# Patient Record
Sex: Male | Born: 1950 | Race: White | Hispanic: No | Marital: Married | Smoking: Former smoker
Health system: Southern US, Community
[De-identification: ages and names within clinical notes are randomized; demographics above are authoritative.]

## PROBLEM LIST (undated history)

## (undated) DIAGNOSIS — I714 Abdominal aortic aneurysm, without rupture, unspecified: Secondary | ICD-10-CM

## (undated) HISTORY — PX: CARDIAC CATHETERIZATION: SHX172

## (undated) HISTORY — DX: Abdominal aortic aneurysm, without rupture, unspecified: I71.40

---

## 1999-01-21 ENCOUNTER — Encounter: Payer: Self-pay | Admitting: Internal Medicine

## 1999-01-21 ENCOUNTER — Ambulatory Visit (HOSPITAL_COMMUNITY): Admission: RE | Admit: 1999-01-21 | Discharge: 1999-01-21 | Payer: Self-pay | Admitting: Internal Medicine

## 2010-09-25 ENCOUNTER — Ambulatory Visit: Payer: Self-pay | Admitting: Internal Medicine

## 2013-01-26 DIAGNOSIS — R7303 Prediabetes: Secondary | ICD-10-CM | POA: Insufficient documentation

## 2013-01-26 DIAGNOSIS — E559 Vitamin D deficiency, unspecified: Secondary | ICD-10-CM | POA: Insufficient documentation

## 2013-01-26 DIAGNOSIS — G4700 Insomnia, unspecified: Secondary | ICD-10-CM | POA: Insufficient documentation

## 2013-09-11 DIAGNOSIS — I252 Old myocardial infarction: Secondary | ICD-10-CM | POA: Insufficient documentation

## 2013-09-11 DIAGNOSIS — G44009 Cluster headache syndrome, unspecified, not intractable: Secondary | ICD-10-CM | POA: Insufficient documentation

## 2013-09-11 DIAGNOSIS — I10 Essential (primary) hypertension: Secondary | ICD-10-CM | POA: Insufficient documentation

## 2013-09-11 HISTORY — DX: Old myocardial infarction: I25.2

## 2014-05-17 DIAGNOSIS — F419 Anxiety disorder, unspecified: Secondary | ICD-10-CM | POA: Insufficient documentation

## 2014-12-02 DIAGNOSIS — Z720 Tobacco use: Secondary | ICD-10-CM | POA: Insufficient documentation

## 2014-12-02 DIAGNOSIS — K219 Gastro-esophageal reflux disease without esophagitis: Secondary | ICD-10-CM | POA: Insufficient documentation

## 2015-02-06 DIAGNOSIS — Z955 Presence of coronary angioplasty implant and graft: Secondary | ICD-10-CM | POA: Insufficient documentation

## 2016-05-01 DIAGNOSIS — F1020 Alcohol dependence, uncomplicated: Secondary | ICD-10-CM | POA: Insufficient documentation

## 2016-07-15 DIAGNOSIS — I714 Abdominal aortic aneurysm, without rupture, unspecified: Secondary | ICD-10-CM | POA: Insufficient documentation

## 2017-01-17 DIAGNOSIS — I2510 Atherosclerotic heart disease of native coronary artery without angina pectoris: Secondary | ICD-10-CM | POA: Insufficient documentation

## 2017-08-20 DIAGNOSIS — D539 Nutritional anemia, unspecified: Secondary | ICD-10-CM | POA: Insufficient documentation

## 2018-03-14 DIAGNOSIS — H6983 Other specified disorders of Eustachian tube, bilateral: Secondary | ICD-10-CM | POA: Insufficient documentation

## 2018-06-22 DIAGNOSIS — N529 Male erectile dysfunction, unspecified: Secondary | ICD-10-CM | POA: Insufficient documentation

## 2019-05-02 DIAGNOSIS — Z880 Allergy status to penicillin: Secondary | ICD-10-CM | POA: Diagnosis not present

## 2019-05-02 DIAGNOSIS — Z1211 Encounter for screening for malignant neoplasm of colon: Secondary | ICD-10-CM | POA: Diagnosis not present

## 2019-05-02 DIAGNOSIS — Z885 Allergy status to narcotic agent status: Secondary | ICD-10-CM | POA: Diagnosis not present

## 2019-05-02 DIAGNOSIS — Z881 Allergy status to other antibiotic agents status: Secondary | ICD-10-CM | POA: Diagnosis not present

## 2019-05-02 DIAGNOSIS — Z9181 History of falling: Secondary | ICD-10-CM | POA: Diagnosis not present

## 2019-05-15 DIAGNOSIS — N529 Male erectile dysfunction, unspecified: Secondary | ICD-10-CM | POA: Diagnosis not present

## 2019-06-20 DIAGNOSIS — E559 Vitamin D deficiency, unspecified: Secondary | ICD-10-CM | POA: Diagnosis not present

## 2019-06-20 DIAGNOSIS — E785 Hyperlipidemia, unspecified: Secondary | ICD-10-CM | POA: Diagnosis not present

## 2019-06-20 DIAGNOSIS — D638 Anemia in other chronic diseases classified elsewhere: Secondary | ICD-10-CM | POA: Diagnosis not present

## 2019-06-20 DIAGNOSIS — N289 Disorder of kidney and ureter, unspecified: Secondary | ICD-10-CM | POA: Diagnosis not present

## 2019-06-20 DIAGNOSIS — F332 Major depressive disorder, recurrent severe without psychotic features: Secondary | ICD-10-CM | POA: Diagnosis not present

## 2019-06-20 DIAGNOSIS — Z125 Encounter for screening for malignant neoplasm of prostate: Secondary | ICD-10-CM | POA: Diagnosis not present

## 2019-06-20 DIAGNOSIS — I714 Abdominal aortic aneurysm, without rupture: Secondary | ICD-10-CM | POA: Diagnosis not present

## 2019-06-20 DIAGNOSIS — I10 Essential (primary) hypertension: Secondary | ICD-10-CM | POA: Diagnosis not present

## 2019-06-20 DIAGNOSIS — R69 Illness, unspecified: Secondary | ICD-10-CM | POA: Diagnosis not present

## 2019-07-17 DIAGNOSIS — G4700 Insomnia, unspecified: Secondary | ICD-10-CM | POA: Diagnosis not present

## 2019-07-17 DIAGNOSIS — K219 Gastro-esophageal reflux disease without esophagitis: Secondary | ICD-10-CM | POA: Diagnosis not present

## 2019-07-17 DIAGNOSIS — Z1211 Encounter for screening for malignant neoplasm of colon: Secondary | ICD-10-CM | POA: Diagnosis not present

## 2019-07-17 DIAGNOSIS — K5730 Diverticulosis of large intestine without perforation or abscess without bleeding: Secondary | ICD-10-CM | POA: Diagnosis not present

## 2019-07-17 DIAGNOSIS — E785 Hyperlipidemia, unspecified: Secondary | ICD-10-CM | POA: Diagnosis not present

## 2019-07-17 DIAGNOSIS — I2510 Atherosclerotic heart disease of native coronary artery without angina pectoris: Secondary | ICD-10-CM | POA: Diagnosis not present

## 2019-07-17 DIAGNOSIS — K648 Other hemorrhoids: Secondary | ICD-10-CM | POA: Diagnosis not present

## 2019-07-17 DIAGNOSIS — I10 Essential (primary) hypertension: Secondary | ICD-10-CM | POA: Diagnosis not present

## 2019-07-17 DIAGNOSIS — Z808 Family history of malignant neoplasm of other organs or systems: Secondary | ICD-10-CM | POA: Diagnosis not present

## 2019-07-17 DIAGNOSIS — F419 Anxiety disorder, unspecified: Secondary | ICD-10-CM | POA: Diagnosis not present

## 2019-07-17 DIAGNOSIS — Z87891 Personal history of nicotine dependence: Secondary | ICD-10-CM | POA: Diagnosis not present

## 2019-07-17 DIAGNOSIS — I714 Abdominal aortic aneurysm, without rupture: Secondary | ICD-10-CM | POA: Diagnosis not present

## 2019-07-17 DIAGNOSIS — D638 Anemia in other chronic diseases classified elsewhere: Secondary | ICD-10-CM | POA: Diagnosis not present

## 2019-07-17 LAB — HM COLONOSCOPY

## 2019-08-03 DIAGNOSIS — M5030 Other cervical disc degeneration, unspecified cervical region: Secondary | ICD-10-CM | POA: Insufficient documentation

## 2019-08-03 DIAGNOSIS — I10 Essential (primary) hypertension: Secondary | ICD-10-CM | POA: Diagnosis not present

## 2019-08-03 DIAGNOSIS — N529 Male erectile dysfunction, unspecified: Secondary | ICD-10-CM | POA: Diagnosis not present

## 2019-12-07 DIAGNOSIS — M5408 Panniculitis affecting regions of neck and back, sacral and sacrococcygeal region: Secondary | ICD-10-CM | POA: Diagnosis not present

## 2019-12-07 DIAGNOSIS — M9902 Segmental and somatic dysfunction of thoracic region: Secondary | ICD-10-CM | POA: Diagnosis not present

## 2019-12-07 DIAGNOSIS — M9903 Segmental and somatic dysfunction of lumbar region: Secondary | ICD-10-CM | POA: Diagnosis not present

## 2019-12-07 DIAGNOSIS — M9901 Segmental and somatic dysfunction of cervical region: Secondary | ICD-10-CM | POA: Diagnosis not present

## 2020-02-26 DIAGNOSIS — M9901 Segmental and somatic dysfunction of cervical region: Secondary | ICD-10-CM | POA: Diagnosis not present

## 2020-02-26 DIAGNOSIS — M9903 Segmental and somatic dysfunction of lumbar region: Secondary | ICD-10-CM | POA: Diagnosis not present

## 2020-02-26 DIAGNOSIS — Z23 Encounter for immunization: Secondary | ICD-10-CM | POA: Diagnosis not present

## 2020-02-26 DIAGNOSIS — M5408 Panniculitis affecting regions of neck and back, sacral and sacrococcygeal region: Secondary | ICD-10-CM | POA: Diagnosis not present

## 2020-02-26 DIAGNOSIS — M9902 Segmental and somatic dysfunction of thoracic region: Secondary | ICD-10-CM | POA: Diagnosis not present

## 2020-02-27 DIAGNOSIS — H5203 Hypermetropia, bilateral: Secondary | ICD-10-CM | POA: Diagnosis not present

## 2020-03-01 DIAGNOSIS — Z0100 Encounter for examination of eyes and vision without abnormal findings: Secondary | ICD-10-CM | POA: Diagnosis not present

## 2020-03-11 DIAGNOSIS — M9901 Segmental and somatic dysfunction of cervical region: Secondary | ICD-10-CM | POA: Diagnosis not present

## 2020-03-11 DIAGNOSIS — M9903 Segmental and somatic dysfunction of lumbar region: Secondary | ICD-10-CM | POA: Diagnosis not present

## 2020-03-11 DIAGNOSIS — M9902 Segmental and somatic dysfunction of thoracic region: Secondary | ICD-10-CM | POA: Diagnosis not present

## 2020-03-11 DIAGNOSIS — M5408 Panniculitis affecting regions of neck and back, sacral and sacrococcygeal region: Secondary | ICD-10-CM | POA: Diagnosis not present

## 2021-02-08 DIAGNOSIS — E785 Hyperlipidemia, unspecified: Secondary | ICD-10-CM | POA: Diagnosis not present

## 2021-02-17 DIAGNOSIS — R0600 Dyspnea, unspecified: Secondary | ICD-10-CM | POA: Diagnosis not present

## 2021-02-17 DIAGNOSIS — Z79899 Other long term (current) drug therapy: Secondary | ICD-10-CM | POA: Diagnosis not present

## 2021-02-17 DIAGNOSIS — Z20822 Contact with and (suspected) exposure to covid-19: Secondary | ICD-10-CM | POA: Diagnosis not present

## 2021-02-17 DIAGNOSIS — R0602 Shortness of breath: Secondary | ICD-10-CM | POA: Diagnosis not present

## 2021-02-17 DIAGNOSIS — I10 Essential (primary) hypertension: Secondary | ICD-10-CM | POA: Diagnosis not present

## 2021-03-07 DIAGNOSIS — U071 COVID-19: Secondary | ICD-10-CM | POA: Diagnosis not present

## 2021-03-08 ENCOUNTER — Ambulatory Visit: Payer: Self-pay

## 2021-04-03 DIAGNOSIS — N529 Male erectile dysfunction, unspecified: Secondary | ICD-10-CM | POA: Diagnosis not present

## 2021-04-03 DIAGNOSIS — I10 Essential (primary) hypertension: Secondary | ICD-10-CM | POA: Diagnosis not present

## 2021-04-03 DIAGNOSIS — H6122 Impacted cerumen, left ear: Secondary | ICD-10-CM | POA: Diagnosis not present

## 2021-04-03 DIAGNOSIS — I714 Abdominal aortic aneurysm, without rupture, unspecified: Secondary | ICD-10-CM | POA: Diagnosis not present

## 2021-04-03 DIAGNOSIS — E785 Hyperlipidemia, unspecified: Secondary | ICD-10-CM | POA: Diagnosis not present

## 2021-04-03 DIAGNOSIS — R69 Illness, unspecified: Secondary | ICD-10-CM | POA: Diagnosis not present

## 2021-04-03 DIAGNOSIS — J343 Hypertrophy of nasal turbinates: Secondary | ICD-10-CM | POA: Diagnosis not present

## 2021-04-03 DIAGNOSIS — D229 Melanocytic nevi, unspecified: Secondary | ICD-10-CM | POA: Diagnosis not present

## 2021-04-03 DIAGNOSIS — Z8379 Family history of other diseases of the digestive system: Secondary | ICD-10-CM | POA: Diagnosis not present

## 2021-04-03 DIAGNOSIS — H6983 Other specified disorders of Eustachian tube, bilateral: Secondary | ICD-10-CM | POA: Diagnosis not present

## 2021-04-03 DIAGNOSIS — E538 Deficiency of other specified B group vitamins: Secondary | ICD-10-CM | POA: Diagnosis not present

## 2021-04-03 DIAGNOSIS — E559 Vitamin D deficiency, unspecified: Secondary | ICD-10-CM | POA: Diagnosis not present

## 2021-04-03 DIAGNOSIS — I25118 Atherosclerotic heart disease of native coronary artery with other forms of angina pectoris: Secondary | ICD-10-CM | POA: Diagnosis not present

## 2021-04-03 DIAGNOSIS — M79642 Pain in left hand: Secondary | ICD-10-CM | POA: Diagnosis not present

## 2021-04-03 DIAGNOSIS — K2960 Other gastritis without bleeding: Secondary | ICD-10-CM | POA: Diagnosis not present

## 2021-04-08 ENCOUNTER — Other Ambulatory Visit: Payer: Self-pay | Admitting: Nurse Practitioner

## 2021-04-08 ENCOUNTER — Other Ambulatory Visit (HOSPITAL_COMMUNITY): Payer: Self-pay | Admitting: Nurse Practitioner

## 2021-04-08 DIAGNOSIS — I714 Abdominal aortic aneurysm, without rupture, unspecified: Secondary | ICD-10-CM

## 2021-04-13 ENCOUNTER — Encounter: Payer: Self-pay | Admitting: *Deleted

## 2021-04-16 ENCOUNTER — Other Ambulatory Visit (HOSPITAL_COMMUNITY): Payer: Self-pay | Admitting: Nurse Practitioner

## 2021-04-16 DIAGNOSIS — M79642 Pain in left hand: Secondary | ICD-10-CM

## 2021-04-20 ENCOUNTER — Encounter: Payer: Self-pay | Admitting: Internal Medicine

## 2021-04-20 ENCOUNTER — Ambulatory Visit (INDEPENDENT_AMBULATORY_CARE_PROVIDER_SITE_OTHER): Payer: Medicare HMO | Admitting: Internal Medicine

## 2021-04-20 ENCOUNTER — Other Ambulatory Visit: Payer: Self-pay

## 2021-04-20 VITALS — BP 108/78 | HR 97 | Resp 18 | Ht 70.0 in | Wt 194.0 lb

## 2021-04-20 DIAGNOSIS — I7143 Infrarenal abdominal aortic aneurysm, without rupture: Secondary | ICD-10-CM | POA: Diagnosis not present

## 2021-04-20 DIAGNOSIS — I2510 Atherosclerotic heart disease of native coronary artery without angina pectoris: Secondary | ICD-10-CM

## 2021-04-20 DIAGNOSIS — F5101 Primary insomnia: Secondary | ICD-10-CM

## 2021-04-20 DIAGNOSIS — E782 Mixed hyperlipidemia: Secondary | ICD-10-CM

## 2021-04-20 DIAGNOSIS — K219 Gastro-esophageal reflux disease without esophagitis: Secondary | ICD-10-CM

## 2021-04-20 DIAGNOSIS — N529 Male erectile dysfunction, unspecified: Secondary | ICD-10-CM | POA: Diagnosis not present

## 2021-04-20 DIAGNOSIS — H6983 Other specified disorders of Eustachian tube, bilateral: Secondary | ICD-10-CM

## 2021-04-20 DIAGNOSIS — J309 Allergic rhinitis, unspecified: Secondary | ICD-10-CM | POA: Diagnosis not present

## 2021-04-20 DIAGNOSIS — I71012 Dissection of descending thoracic aorta: Secondary | ICD-10-CM | POA: Insufficient documentation

## 2021-04-20 DIAGNOSIS — R69 Illness, unspecified: Secondary | ICD-10-CM | POA: Diagnosis not present

## 2021-04-20 DIAGNOSIS — H6993 Unspecified Eustachian tube disorder, bilateral: Secondary | ICD-10-CM

## 2021-04-20 MED ORDER — METOPROLOL SUCCINATE ER 25 MG PO TB24: 25.0000 mg | ORAL_TABLET | Freq: Every day | ORAL | 1 refills | Status: DC

## 2021-04-20 MED ORDER — ATORVASTATIN CALCIUM 40 MG PO TABS: 40.0000 mg | ORAL_TABLET | Freq: Every day | ORAL | 1 refills | Status: DC

## 2021-04-20 MED ORDER — TRAZODONE HCL 50 MG PO TABS: 50.0000 mg | ORAL_TABLET | Freq: Every day | ORAL | 1 refills | Status: DC

## 2021-04-20 NOTE — Patient Instructions (Signed)
Please continue taking medications as prescribed.  Please schedule CTA abdomen when you get chance soon.  Please contact Cardiology and Urology to schedule appointment.

## 2021-04-22 ENCOUNTER — Ambulatory Visit (HOSPITAL_COMMUNITY)
Admission: RE | Admit: 2021-04-22 | Discharge: 2021-04-22 | Disposition: A | Discharge status: Home / Self Care | Payer: Medicare HMO | Source: Ambulatory Visit | Attending: Nurse Practitioner | Admitting: Nurse Practitioner

## 2021-04-22 ENCOUNTER — Other Ambulatory Visit: Payer: Self-pay

## 2021-04-22 DIAGNOSIS — M79642 Pain in left hand: Secondary | ICD-10-CM | POA: Insufficient documentation

## 2021-04-24 DIAGNOSIS — J309 Allergic rhinitis, unspecified: Secondary | ICD-10-CM | POA: Insufficient documentation

## 2021-04-24 DIAGNOSIS — I7143 Infrarenal abdominal aortic aneurysm, without rupture: Secondary | ICD-10-CM | POA: Insufficient documentation

## 2021-04-24 DIAGNOSIS — E782 Mixed hyperlipidemia: Secondary | ICD-10-CM | POA: Insufficient documentation

## 2021-04-24 NOTE — Progress Notes (Signed)
New Patient Office Visit  Subjective:  Patient ID: JASON FRISBEE, male    DOB: [birth year]  Age: 71 y.o. MRN: [id number]  CC:  Chief Complaint  Patient presents with   New Patient (Initial Visit)    New patient was seeing Maceo just saw 10days ago establishing care     HPI JURIEL CID is a 71 y.o. male with past medical history of CAD s/p stent placement, HLD and aortic aneurysm who presents for establishing care.  He recently saw an NP at [facility] and was referred to cardiology, vascular surgery, ENT and dermatology.  CAD s/p stent placement: He used to follow-up with cardiology in the past.  He currently takes metoprolol and statin.  Denies any chest pain, dyspnea or palpitations.  He is to schedule appointment with cardiology in [place].  He has not required nitroglycerin for a long time.  History of aortic aneurysm: Has had stable in size infrarenal abdominal aortic aneurysm, for which he used to see vascular surgery.  He had been traveling for about 18 months and was not able to see Vascular surgeon and Cardiologist.  He denies any abdominal pain currently.  He has history of erectile dysfunction, for which he uses sildenafil.  He was referred to ENT specialist for allergic rhinitis and eustachian tube dysfunction, and is awaiting appointment.  He was also referred to dermatology for a mole on his back.   Past Medical History:  Diagnosis Date   Old myocardial infarction 09/11/2013   Formatting of this note might be different from the original. Overview:  01/2011 Formatting of this note might be different from the original. 01/2011    History reviewed. No pertinent surgical history.  History reviewed. No pertinent family history.  Social History   Socioeconomic History   Marital status: Single    Spouse name: Not on file   Number of children: Not on file   Years of education: Not on file   Highest education level: Not on file  Occupational  History   Not on file  Tobacco Use   Smoking status: Former    Types: Cigarettes    Quit date: 06/02/2018    Years since quitting: 2.8   Smokeless tobacco: Never  Substance and Sexual Activity   Alcohol use: Not Currently   Drug use: Never   Sexual activity: Not on file  Other Topics Concern   Not on file  Social History Narrative   Not on file   Social Determinants of Health   Financial Resource Strain: Not on file  Food Insecurity: Not on file  Transportation Needs: Not on file  Physical Activity: Not on file  Stress: Not on file  Social Connections: Not on file  Intimate Partner Violence: Not on file    ROS Review of Systems  Constitutional:  Negative for chills and fever.  HENT:  Positive for congestion, postnasal drip and rhinorrhea. Negative for sore throat.   Eyes:  Negative for pain and discharge.  Respiratory:  Negative for cough and shortness of breath.   Cardiovascular:  Negative for chest pain and palpitations.  Gastrointestinal:  Negative for diarrhea, nausea and vomiting.  Endocrine: Negative for polydipsia and polyuria.  Genitourinary:  Negative for dysuria and hematuria.  Musculoskeletal:  Negative for neck pain and neck stiffness.  Skin:  Negative for rash.       Mole on back  Neurological:  Negative for dizziness, weakness, numbness and headaches.  Psychiatric/Behavioral:  Negative for agitation  and behavioral problems.    Objective:   Today's Vitals: BP 108/78 (BP Location: Right Arm, Patient Position: Sitting, Cuff Size: Normal)    Pulse 97    Resp 18    Ht 5\' 10"  (1.778 m)    Wt 194 lb 0.6 oz (88 kg)    SpO2 98%    BMI 27.84 kg/m   Physical Exam Vitals reviewed.  Constitutional:      General: He is not in acute distress.    Appearance: He is not diaphoretic.  HENT:     Head: Normocephalic and atraumatic.     Nose: Nose normal.     Mouth/Throat:     Mouth: Mucous membranes are moist.  Eyes:     General: No scleral icterus.    Extraocular  Movements: Extraocular movements intact.  Cardiovascular:     Rate and Rhythm: Normal rate and regular rhythm.     Pulses: Normal pulses.     Heart sounds: Normal heart sounds. No murmur heard. Pulmonary:     Breath sounds: Normal breath sounds. No wheezing or rales.  Abdominal:     Palpations: Abdomen is soft.     Tenderness: There is no abdominal tenderness.  Musculoskeletal:     Cervical back: Neck supple. No tenderness.     Right lower leg: No edema.     Left lower leg: No edema.  Skin:    General: Skin is warm.     Findings: No rash.  Neurological:     General: No focal deficit present.     Mental Status: He is alert and oriented to person, place, and time.     Sensory: No sensory deficit.     Motor: No weakness.  Psychiatric:        Mood and Affect: Mood normal.        Behavior: Behavior normal.    Assessment & Plan:   Problem List Items Addressed This Visit       Cardiovascular and Mediastinum   Coronary artery disease - Primary    S/p stent placement Was referred to Cardiology recently, has not scheduled appointment yet No chest pain currently On aspirin and statin Takes beta-blocker and as needed nitroglycerin, has not required it lately      Relevant Medications   nitroGLYCERIN (NITROSTAT) 0.4 MG SL tablet   sildenafil (REVATIO) 20 MG tablet   atorvastatin (LIPITOR) 40 MG tablet   metoprolol succinate (TOPROL-XL) 25 MG 24 hr tablet   Infrarenal abdominal aortic aneurysm (AAA) without rupture    Used to follow-up with vascular surgery, needs to schedule appointment      Relevant Medications   nitroGLYCERIN (NITROSTAT) 0.4 MG SL tablet   sildenafil (REVATIO) 20 MG tablet   atorvastatin (LIPITOR) 40 MG tablet   metoprolol succinate (TOPROL-XL) 25 MG 24 hr tablet     Respiratory   Allergic sinusitis    Uses Flonase Has been referred to ENT specialist by [facility]      Relevant Medications   fluticasone (FLONASE) 50 MCG/ACT nasal spray      Digestive   Gastro-esophageal reflux disease without esophagitis    Well controlled with omeprazole      Relevant Medications   omeprazole (PRILOSEC) 40 MG capsule     Nervous and Auditory   Dysfunction of both eustachian tubes    Was referred to ENT specialist recently, awaiting appointment        Other   Erectile dysfunction    Takes sildenafil as needed  Aware that he should not take nitroglycerin and sildenafil together      Insomnia    Takes trazodone 50 mg nightly as needed      Relevant Medications   traZODone (DESYREL) 50 MG tablet   Mixed hyperlipidemia    On statin Check lipid profile in the next visit      Relevant Medications   nitroGLYCERIN (NITROSTAT) 0.4 MG SL tablet   sildenafil (REVATIO) 20 MG tablet   atorvastatin (LIPITOR) 40 MG tablet   metoprolol succinate (TOPROL-XL) 25 MG 24 hr tablet    Outpatient Encounter Medications as of 04/20/2021  Medication Sig   ergocalciferol (VITAMIN D2) 1.25 MG (50000 UT) capsule Take by mouth.   fluticasone (FLONASE) 50 MCG/ACT nasal spray SMARTSIG:1 Spray(s) Both Nares Daily PRN   magnesium 30 MG tablet Take by mouth.   nitroGLYCERIN (NITROSTAT) 0.4 MG SL tablet SMARTSIG:1 Tablet(s) Sublingual PRN   omeprazole (PRILOSEC) 40 MG capsule Take 40 mg by mouth daily.   sildenafil (REVATIO) 20 MG tablet Take 20 mg by mouth as needed.   [DISCONTINUED] atorvastatin (LIPITOR) 40 MG tablet Take 40 mg by mouth daily.   [DISCONTINUED] metoprolol succinate (TOPROL-XL) 25 MG 24 hr tablet Take 25 mg by mouth daily.   [DISCONTINUED] traZODone (DESYREL) 50 MG tablet Take by mouth.   atorvastatin (LIPITOR) 40 MG tablet Take 1 tablet (40 mg total) by mouth daily.   metoprolol succinate (TOPROL-XL) 25 MG 24 hr tablet Take 1 tablet (25 mg total) by mouth daily.   traZODone (DESYREL) 50 MG tablet Take 1 tablet (50 mg total) by mouth at bedtime.   No facility-administered encounter medications on file as of 04/20/2021.    Follow-up:  Return in about 4 months (around 08/18/2021) for CAD, aortic aneurysm and insomnia.   Lindell Spar, MD

## 2021-04-24 NOTE — Assessment & Plan Note (Signed)
Takes sildenafil as needed Aware that he should not take nitroglycerin and sildenafil together

## 2021-04-24 NOTE — Assessment & Plan Note (Signed)
Takes trazodone 50 mg nightly as needed 

## 2021-04-24 NOTE — Assessment & Plan Note (Signed)
On statin Check lipid profile in the next visit

## 2021-04-24 NOTE — Assessment & Plan Note (Addendum)
S/p stent placement Was referred to Cardiology recently, has not scheduled appointment yet No chest pain currently On aspirin and statin Takes beta-blocker and as needed nitroglycerin, has not required it lately

## 2021-04-24 NOTE — Assessment & Plan Note (Signed)
Well controlled with omeprazole. 

## 2021-04-24 NOTE — Assessment & Plan Note (Signed)
Was referred to ENT specialist recently, awaiting appointment

## 2021-04-24 NOTE — Assessment & Plan Note (Signed)
Uses Flonase Has been referred to ENT specialist by [facility]

## 2021-04-24 NOTE — Assessment & Plan Note (Signed)
Used to follow-up with vascular surgery, needs to schedule appointment 

## 2021-05-05 ENCOUNTER — Other Ambulatory Visit (HOSPITAL_COMMUNITY): Payer: Self-pay | Admitting: Vascular Surgery

## 2021-05-05 DIAGNOSIS — I714 Abdominal aortic aneurysm, without rupture, unspecified: Secondary | ICD-10-CM

## 2021-05-06 ENCOUNTER — Ambulatory Visit (INDEPENDENT_AMBULATORY_CARE_PROVIDER_SITE_OTHER): Payer: Medicare HMO

## 2021-05-06 ENCOUNTER — Encounter: Payer: Self-pay | Admitting: Vascular Surgery

## 2021-05-06 ENCOUNTER — Ambulatory Visit: Payer: Medicare HMO | Admitting: Vascular Surgery

## 2021-05-06 ENCOUNTER — Other Ambulatory Visit: Payer: Self-pay

## 2021-05-06 VITALS — BP 136/84 | HR 77 | Temp 98.2°F | Resp 14 | Ht 70.0 in | Wt 194.0 lb

## 2021-05-06 DIAGNOSIS — I7143 Infrarenal abdominal aortic aneurysm, without rupture: Secondary | ICD-10-CM | POA: Diagnosis not present

## 2021-05-06 DIAGNOSIS — I714 Abdominal aortic aneurysm, without rupture, unspecified: Secondary | ICD-10-CM | POA: Diagnosis not present

## 2021-05-06 NOTE — Progress Notes (Signed)
[facility] [place]  Patient name: Jeremy Jenkins MRN: [id number] DOB: July 02, 1950 Sex: male  REASON FOR CONSULT: Evaluation abdominal aortic aneurysm  HPI: Jeremy Jenkins is a 71 y.o. male, who is here today for follow-up abdominal arctic aneurysm.  He is here today with his fiance who will be married later this week.  He has a noted history of aneurysm.  He thinks that this dates back to 2018.  He recalls being told on various times that his aneurysm ranged from 3.5-4.5.  I do not have any documentation of prior size.  He has no symptoms referable to his aneurysm.  Does have a history of myocardial infarction and coronary stenting in the past.  He has no family history of aneurysmal disease  Past Medical History:  Diagnosis Date   Old myocardial infarction 09/11/2013   Formatting of this note might be different from the original. Overview:  01/2011 Formatting of this note might be different from the original. 01/2011    History reviewed. No pertinent family history.  SOCIAL HISTORY: Social History   Socioeconomic History   Marital status: Single    Spouse name: Not on file   Number of children: Not on file   Years of education: Not on file   Highest education level: Not on file  Occupational History   Not on file  Tobacco Use   Smoking status: Former    Types: Cigarettes    Quit date: 06/02/2018    Years since quitting: 2.9   Smokeless tobacco: Never  Substance and Sexual Activity   Alcohol use: Not Currently   Drug use: Never   Sexual activity: Not on file  Other Topics Concern   Not on file  Social History Narrative   Not on file   Social Determinants of Health   Financial Resource Strain: Not on file  Food Insecurity: Not on file  Transportation Needs: Not on file  Physical Activity: Not on file  Stress: Not on file  Social Connections: Not on file  Intimate Partner Violence: Not on file     Allergies  Allergen Reactions   Albuterol Anaphylaxis   Azithromycin Tinitus    Hearing loss    Penicillins Shortness Of Breath   Tadalafil Shortness Of Breath   Aspartame And Phenylalanine Other (See Comments)    HEADACHES HEADACHES HEADACHES HEADACHES    Topiramate Other (See Comments)    Memory loss Memory loss Memory loss    Buspirone Other (See Comments)    Memory loss Memory loss Memory loss Memory loss    Hydrocodone Other (See Comments)    Tingling all over Tingling all over Tingling all over     Current Outpatient Medications  Medication Sig Dispense Refill   atorvastatin (LIPITOR) 40 MG tablet Take 1 tablet (40 mg total) by mouth daily. 90 tablet 1   ergocalciferol (VITAMIN D2) 1.25 MG (50000 UT) capsule Take by mouth.     fluticasone (FLONASE) 50 MCG/ACT nasal spray SMARTSIG:1 Spray(s) Both Nares Daily PRN     magnesium 30 MG tablet Take by mouth.     metoprolol succinate (TOPROL-XL) 25 MG 24 hr tablet Take 1 tablet (25 mg total) by mouth daily. 90 tablet 1   nitroGLYCERIN (NITROSTAT) 0.4 MG SL tablet SMARTSIG:1 Tablet(s) Sublingual PRN     omeprazole (PRILOSEC) 40 MG capsule Take 40 mg by mouth daily.     sildenafil (REVATIO) 20 MG tablet Take 20 mg by mouth as needed.  traZODone (DESYREL) 50 MG tablet Take 1 tablet (50 mg total) by mouth at bedtime. 90 tablet 1   No current facility-administered medications for this visit.    REVIEW OF SYSTEMS:  [X]  denotes positive finding, [ ]  denotes negative finding Cardiac  Comments:  Chest pain or chest pressure:    Shortness of breath upon exertion:    Short of breath when lying flat:    Irregular heart rhythm:        Vascular    Pain in calf, thigh, or hip brought on by ambulation:    Pain in feet at night that wakes you up from your sleep:     Blood clot in your veins:    Leg swelling:         Pulmonary    Oxygen at home:    Productive cough:  x   Wheezing:         Neurologic    Sudden  weakness in arms or legs:     Sudden numbness in arms or legs:     Sudden onset of difficulty speaking or slurred speech:    Temporary loss of vision in one eye:     Problems with dizziness:         Gastrointestinal    Blood in stool:     Vomited blood:         Genitourinary    Burning when urinating:     Blood in urine:        Psychiatric    Major depression:         Hematologic    Bleeding problems:    Problems with blood clotting too easily:        Skin    Rashes or ulcers:        Constitutional    Fever or chills:      PHYSICAL EXAM: Vitals:   05/06/21 1249  BP: 136/84  Pulse: 77  Resp: 14  Temp: 98.2 F (36.8 C)  TempSrc: Temporal  SpO2: 96%  Weight: 194 lb (88 kg)  Height: 5\' 10"  (1.778 m)    GENERAL: The patient is a well-nourished male, in no acute distress. The vital signs are documented above. CARDIOVASCULAR: 2+ radial 2+ femoral pulses.  2+ popliteal pulse with no evidence of femoral or popliteal aneurysms.  I do not feel an abdominal aortic aneurysm PULMONARY: There is good air exchange  MUSCULOSKELETAL: There are no major deformities or cyanosis. NEUROLOGIC: No focal weakness or paresthesias are detected. SKIN: There are no ulcers or rashes noted. PSYCHIATRIC: The patient has a normal affect.  DATA:  Ultrasound today reveals maximal diameter of his aneurysm at 4.5 cm.  He does have some ectasia of his iliac arteries bilaterally but no aneurysm  MEDICAL ISSUES: Had long discussion with the patient and his family.  I explained the typical nature of aneurysms with slow growth.  I also explained that we would typically recommend repair at the 5.5 cm aneurysm unless he had rapid growth.  Explained the importance of normal blood pressure control.  I did discuss repair of aneurysm with both open and stent graft repair.  Also discussed symptoms of leaking aneurysm and that he should report immediately to the hospital should this occur.  We will see him again  in 1 year with repeat ultrasound of his aorta   Rosetta Posner, MD [facility] [phone] Pager [phone]  Note: Portions of this report  may have been transcribed using voice recognition software.  Every effort has been made to ensure accuracy; however, inadvertent computerized transcription errors may still be present.

## 2021-05-07 ENCOUNTER — Telehealth: Payer: Self-pay

## 2021-05-07 NOTE — Telephone Encounter (Signed)
Pt states that he does not need a AWV he just saw Dr Posey Pronto and everything is fine, he lives in a new house still unboxing things and a AWV would not be beneficial for him or the dr's office.  Maybe next year things may be different but for now no AWV.

## 2021-05-07 NOTE — Progress Notes (Deleted)
Pt states that he just saw Dr Posey Pronto he is great in a new house trying to unbox things and a AWV would not be beneficial to either him or the office.  Maybe next year things will be different but for now he does want to do a AWV.

## 2021-05-13 NOTE — Progress Notes (Unsigned)
Error

## 2021-05-18 ENCOUNTER — Other Ambulatory Visit: Payer: Self-pay

## 2021-05-18 ENCOUNTER — Encounter (HOSPITAL_COMMUNITY): Payer: Self-pay

## 2021-05-18 ENCOUNTER — Emergency Department (HOSPITAL_COMMUNITY): Payer: Medicare HMO

## 2021-05-18 ENCOUNTER — Inpatient Hospital Stay (HOSPITAL_COMMUNITY)
Admission: EM | Admit: 2021-05-18 | Discharge: 2021-05-20 | DRG: 247 | Disposition: A | Discharge status: Home / Self Care | Payer: Medicare HMO | Attending: Cardiovascular Disease | Admitting: Cardiovascular Disease

## 2021-05-18 DIAGNOSIS — I10 Essential (primary) hypertension: Secondary | ICD-10-CM | POA: Diagnosis present

## 2021-05-18 DIAGNOSIS — F1010 Alcohol abuse, uncomplicated: Secondary | ICD-10-CM | POA: Diagnosis present

## 2021-05-18 DIAGNOSIS — T82855A Stenosis of coronary artery stent, initial encounter: Secondary | ICD-10-CM | POA: Diagnosis present

## 2021-05-18 DIAGNOSIS — E785 Hyperlipidemia, unspecified: Secondary | ICD-10-CM | POA: Diagnosis present

## 2021-05-18 DIAGNOSIS — R079 Chest pain, unspecified: Secondary | ICD-10-CM | POA: Diagnosis not present

## 2021-05-18 DIAGNOSIS — I7143 Infrarenal abdominal aortic aneurysm, without rupture: Secondary | ICD-10-CM | POA: Diagnosis present

## 2021-05-18 DIAGNOSIS — Z955 Presence of coronary angioplasty implant and graft: Secondary | ICD-10-CM

## 2021-05-18 DIAGNOSIS — Z79899 Other long term (current) drug therapy: Secondary | ICD-10-CM

## 2021-05-18 DIAGNOSIS — Y831 Surgical operation with implant of artificial internal device as the cause of abnormal reaction of the patient, or of later complication, without mention of misadventure at the time of the procedure: Secondary | ICD-10-CM | POA: Diagnosis present

## 2021-05-18 DIAGNOSIS — I714 Abdominal aortic aneurysm, without rupture, unspecified: Secondary | ICD-10-CM

## 2021-05-18 DIAGNOSIS — I252 Old myocardial infarction: Secondary | ICD-10-CM

## 2021-05-18 DIAGNOSIS — I25110 Atherosclerotic heart disease of native coronary artery with unstable angina pectoris: Secondary | ICD-10-CM | POA: Diagnosis present

## 2021-05-18 DIAGNOSIS — Z881 Allergy status to other antibiotic agents status: Secondary | ICD-10-CM

## 2021-05-18 DIAGNOSIS — I214 Non-ST elevation (NSTEMI) myocardial infarction: Principal | ICD-10-CM | POA: Diagnosis present

## 2021-05-18 DIAGNOSIS — Z87891 Personal history of nicotine dependence: Secondary | ICD-10-CM

## 2021-05-18 DIAGNOSIS — K219 Gastro-esophageal reflux disease without esophagitis: Secondary | ICD-10-CM | POA: Diagnosis present

## 2021-05-18 DIAGNOSIS — Z888 Allergy status to other drugs, medicaments and biological substances status: Secondary | ICD-10-CM

## 2021-05-18 DIAGNOSIS — Z885 Allergy status to narcotic agent status: Secondary | ICD-10-CM

## 2021-05-18 DIAGNOSIS — R7303 Prediabetes: Secondary | ICD-10-CM | POA: Diagnosis present

## 2021-05-18 DIAGNOSIS — Z20822 Contact with and (suspected) exposure to covid-19: Secondary | ICD-10-CM | POA: Diagnosis present

## 2021-05-18 LAB — CBC
HCT: 45.5 % (ref 39.0–52.0)
Hemoglobin: 14.4 g/dL (ref 13.0–17.0)
MCH: 30.3 pg (ref 26.0–34.0)
MCHC: 31.6 g/dL (ref 30.0–36.0)
MCV: 95.8 fL (ref 80.0–100.0)
Platelets: 206 10*3/uL (ref 150–400)
RBC: 4.75 MIL/uL (ref 4.22–5.81)
RDW: 13.7 % (ref 11.5–15.5)
WBC: 11 10*3/uL — ABNORMAL HIGH (ref 4.0–10.5)
nRBC: 0 % (ref 0.0–0.2)

## 2021-05-18 LAB — BASIC METABOLIC PANEL
Anion gap: 13 (ref 5–15)
BUN: 18 mg/dL (ref 8–23)
CO2: 26 mmol/L (ref 22–32)
Calcium: 9.2 mg/dL (ref 8.9–10.3)
Chloride: 100 mmol/L (ref 98–111)
Creatinine, Ser: 1.11 mg/dL (ref 0.61–1.24)
GFR, Estimated: >60 mL/min (ref >60)
Glucose, Bld: 130 mg/dL — ABNORMAL HIGH (ref 70–99)
Potassium: 3.6 mmol/L (ref 3.5–5.1)
Sodium: 139 mmol/L (ref 135–145)

## 2021-05-18 LAB — HEPATIC FUNCTION PANEL
ALT: 9 U/L (ref 0–44)
AST: 17 U/L (ref 15–41)
Albumin: 4.1 g/dL (ref 3.5–5.0)
Alkaline Phosphatase: 89 U/L (ref 38–126)
Bilirubin, Direct: <0.1 mg/dL (ref 0.0–0.2)
Total Bilirubin: 0.9 mg/dL (ref 0.3–1.2)
Total Protein: 7.2 g/dL (ref 6.5–8.1)

## 2021-05-18 LAB — RESP PANEL BY RT-PCR (FLU A&B, COVID) ARPGX2
Influenza A by PCR: NEGATIVE
Influenza B by PCR: NEGATIVE
SARS Coronavirus 2 by RT PCR: NEGATIVE

## 2021-05-18 LAB — TROPONIN I (HIGH SENSITIVITY): Troponin I (High Sensitivity): 42 ng/L — ABNORMAL HIGH (ref <18)

## 2021-05-18 MED ORDER — ASPIRIN 325 MG PO TABS
325.0000 mg | ORAL_TABLET | Freq: Once | ORAL | Status: AC
  Administered 2021-05-18: 325 mg via ORAL
  Filled 2021-05-18: qty 1

## 2021-05-18 MED ORDER — HEPARIN (PORCINE) 25000 UT/250ML-% IV SOLN
1200.0000 [IU]/h | INTRAVENOUS | Status: DC
  Administered 2021-05-18: 23:00:00 1200 [IU]/h via INTRAVENOUS
  Filled 2021-05-18: qty 250

## 2021-05-18 MED ORDER — MORPHINE SULFATE (PF) 2 MG/ML IV SOLN
2.0000 mg | Freq: Once | INTRAVENOUS | Status: AC
  Administered 2021-05-18: 2 mg via INTRAVENOUS
  Filled 2021-05-18: qty 1

## 2021-05-18 MED ORDER — NITROGLYCERIN IN D5W 200-5 MCG/ML-% IV SOLN
5.0000 ug/min | INTRAVENOUS | Status: DC
  Filled 2021-05-18: qty 250

## 2021-05-18 MED ORDER — SODIUM CHLORIDE 0.9 % IV BOLUS
500.0000 mL | Freq: Once | INTRAVENOUS | Status: AC
Start: 2021-05-18 — End: 2021-05-18
  Administered 2021-05-18: 500 mL via INTRAVENOUS

## 2021-05-18 MED ORDER — MORPHINE SULFATE (PF) 4 MG/ML IV SOLN
4.0000 mg | Freq: Once | INTRAVENOUS | Status: AC
  Administered 2021-05-18: 4 mg via INTRAVENOUS
  Filled 2021-05-18: qty 1

## 2021-05-18 MED ORDER — NITROGLYCERIN 0.4 MG SL SUBL
0.4000 mg | SUBLINGUAL_TABLET | Freq: Once | SUBLINGUAL | Status: AC
Start: 2021-05-18 — End: 2021-05-18
  Administered 2021-05-18: 0.4 mg via SUBLINGUAL

## 2021-05-18 MED ORDER — HEPARIN BOLUS VIA INFUSION
4000.0000 [IU] | Freq: Once | INTRAVENOUS | Status: AC
Start: 2021-05-18 — End: 2021-05-18
  Administered 2021-05-18: 4000 [IU] via INTRAVENOUS

## 2021-05-18 NOTE — ED Notes (Signed)
Report given to [place], [place] ED Charge Nurse.

## 2021-05-18 NOTE — ED Notes (Signed)
Patient took 1 sublingual nitro at 2019.

## 2021-05-18 NOTE — ED Provider Notes (Signed)
[facility] EMERGENCY DEPARTMENT Provider Note   CSN: [id number] Arrival date & time: 05/18/21  2052     History  Chief Complaint  Patient presents with   Chest Pain    Jeremy Jenkins is a 71 y.o. male.  Patient states that he has been hurting in the left arm since about 8:00.  Patient states that he had an MI in 2013 and had to stents placed at that time and he had similar pain at that time also.  Although at that time he had shortness of breath and he does not have that now.  The history is provided by the patient and medical records. No language interpreter was used.  Chest Pain Pain location:  L chest Pain quality: aching   Radiates to: Left arm. Pain severity:  Moderate Onset quality:  Sudden Timing:  Constant Progression:  Waxing and waning Chronicity:  New Context: not breathing   Relieved by:  Nothing Worsened by:  Nothing Ineffective treatments:  None tried Associated symptoms: no abdominal pain, no back pain, no cough, no fatigue and no headache       Home Medications Prior to Admission medications   Medication Sig Start Date End Date Taking? Authorizing Provider  atorvastatin (LIPITOR) 40 MG tablet Take 1 tablet (40 mg total) by mouth daily. 04/20/21   Lindell Spar, MD  ergocalciferol (VITAMIN D2) 1.25 MG (50000 UT) capsule Take by mouth.    [provider]  fluticasone Asencion Islam) 50 MCG/ACT nasal spray SMARTSIG:1 Spray(s) Both Nares Daily PRN 04/03/21   [provider]  magnesium 30 MG tablet Take by mouth.    [provider]  metoprolol succinate (TOPROL-XL) 25 MG 24 hr tablet Take 1 tablet (25 mg total) by mouth daily. 04/20/21   Lindell Spar, MD  nitroGLYCERIN (NITROSTAT) 0.4 MG SL tablet SMARTSIG:1 Tablet(s) Sublingual PRN 04/03/21   [provider]  omeprazole (PRILOSEC) 40 MG capsule Take 40 mg by mouth daily. 04/03/21   [provider]  sildenafil (REVATIO) 20 MG tablet Take 20 mg by mouth as needed.  12/06/20   [provider]  traZODone (DESYREL) 50 MG tablet Take 1 tablet (50 mg total) by mouth at bedtime. 04/20/21   Lindell Spar, MD      Allergies    Albuterol, Azithromycin, Penicillins, Tadalafil, Aspartame and phenylalanine, Topiramate, Buspirone, and Hydrocodone    Review of Systems   Review of Systems  Constitutional:  Negative for appetite change and fatigue.  HENT:  Negative for congestion, ear discharge and sinus pressure.   Eyes:  Negative for discharge.  Respiratory:  Negative for cough.   Cardiovascular:  Positive for chest pain.  Gastrointestinal:  Negative for abdominal pain and diarrhea.  Genitourinary:  Negative for frequency and hematuria.  Musculoskeletal:  Negative for back pain.  Skin:  Negative for rash.  Neurological:  Negative for seizures and headaches.  Psychiatric/Behavioral:  Negative for hallucinations.    Physical Exam Updated Vital Signs BP 127/82    Pulse 72    Temp 98.2 F (36.8 C) (Oral)    Resp 16    Ht 5\' 10"  (1.778 m)    Wt 88.5 kg    SpO2 96%    BMI 27.98 kg/m  Physical Exam Vitals and nursing note reviewed.  Constitutional:      Appearance: He is well-developed.  HENT:     Head: Normocephalic.     Mouth/Throat:     Mouth: Mucous membranes are moist.  Eyes:  General: No scleral icterus.    Conjunctiva/sclera: Conjunctivae normal.  Neck:     Thyroid: No thyromegaly.  Cardiovascular:     Rate and Rhythm: Normal rate and regular rhythm.     Heart sounds: No murmur heard.   No friction rub. No gallop.  Pulmonary:     Breath sounds: No stridor. No wheezing or rales.  Chest:     Chest wall: No tenderness.  Abdominal:     General: There is no distension.     Tenderness: There is no abdominal tenderness. There is no rebound.  Musculoskeletal:        General: Normal range of motion.     Cervical back: Neck supple.  Lymphadenopathy:     Cervical: No cervical adenopathy.  Skin:    Findings: No erythema or rash.   Neurological:     Mental Status: He is alert and oriented to person, place, and time.     Motor: No abnormal muscle tone.     Coordination: Coordination normal.  Psychiatric:        Behavior: Behavior normal.    ED Results / Procedures / Treatments   Labs (all labs ordered are listed, but only abnormal results are displayed) Labs Reviewed  CBC - Abnormal; Notable for the following components:      Result Value   WBC 11.0 (*)    All other components within normal limits  BASIC METABOLIC PANEL - Abnormal; Notable for the following components:   Glucose, Bld 130 (*)    All other components within normal limits  TROPONIN I (HIGH SENSITIVITY) - Abnormal; Notable for the following components:   Troponin I (High Sensitivity) 42 (*)    All other components within normal limits  RESP PANEL BY RT-PCR (FLU A&B, COVID) ARPGX2  HEPATIC FUNCTION PANEL  TROPONIN I (HIGH SENSITIVITY)    EKG EKG Interpretation  Date/Time:  Monday May 18 2021 20:59:51 EST Ventricular Rate:  86 PR Interval:  146 QRS Duration: 98 QT Interval:  374 QTC Calculation: 447 R Axis:   73 Text Interpretation: Normal sinus rhythm Normal ECG No previous ECGs available Confirmed by Milton Ferguson [phone]) on 05/18/2021 9:30:33 PM  Radiology DG Chest Port 1 View  Result Date: 05/18/2021 CLINICAL DATA:  Chest pain EXAM: PORTABLE CHEST 1 VIEW COMPARISON:  09/10/2017 FINDINGS: Heart and mediastinal contours are within normal limits. No focal opacities or effusions. No acute bony abnormality. IMPRESSION: No active disease. Electronically Signed   By: Rolm Baptise M.D.   On: 05/18/2021 22:30    Procedures Procedures    Medications Ordered in ED Medications  heparin bolus via infusion 4,000 Units (has no administration in time range)  morphine (PF) 4 MG/ML injection 4 mg (has no administration in time range)  nitroGLYCERIN 50 mg in dextrose 5 % 250 mL (0.2 mg/mL) infusion (has no administration in time range)   heparin ADULT infusion 100 units/mL (25000 units/262mL) (has no administration in time range)  morphine (PF) 2 MG/ML injection 2 mg (2 mg Intravenous Given 05/18/21 2206)  nitroGLYCERIN (NITROSTAT) SL tablet 0.4 mg (0.4 mg Sublingual Given 05/18/21 2201)  sodium chloride 0.9 % bolus 500 mL (0 mLs Intravenous Stopped 05/18/21 2242)  aspirin tablet 325 mg (325 mg Oral Given 05/18/21 2249)    ED Course/ Medical Decision Making/ A&P  CRITICAL CARE Performed by: Milton Ferguson Total critical care time: 40 minutes Critical care time was exclusive of separately billable procedures and treating other patients. Critical care was necessary to treat  or prevent imminent or life-threatening deterioration. Critical care was time spent personally by me on the following activities: development of treatment plan with patient and/or surrogate as well as nursing, discussions with consultants, evaluation of patient's response to treatment, examination of patient, obtaining history from patient or surrogate, ordering and performing treatments and interventions, ordering and review of laboratory studies, ordering and review of radiographic studies, pulse oximetry and re-evaluation of patient's condition.    Patient with persistent left arm pain similar to when he had his last MI.  His troponin is slightly elevated.  His EKG shows no changes suggesting STEMI.  I discussed the patient with Dr. Hershal Coria cardiology and he felt like the patient should be put on heparin and transferred to [facility].  Presently there are no cardiology intensive care beds so he is going to be transferred to the emergency department where the cardiologist will see the patient                          Medical Decision Making Amount and/or Complexity of Data Reviewed Labs: ordered. Radiology: ordered.  Risk OTC drugs. Prescription drug management.  Patient has chest pain with an elevated troponin consistent with an  NSTEMI  This patient presents to the ED for concern of chest pain, this involves an extensive number of treatment options, and is a complaint that carries with it a high risk of complications and morbidity.  The differential diagnosis includes MI, PE, pneumonia   Co morbidities that complicate the patient evaluation  Coronary artery disease with stents and elevated lipids   Additional history obtained:  Additional history obtained from significant other External records from outside source obtained and reviewed including hospital records   Lab Tests:  I Ordered, and personally interpreted labs.  The pertinent results include: CBC and chemistries which were unremarkable except for white count 11,000, patient also has a troponin that is elevated at 42   Imaging Studies ordered:  I ordered imaging studies including chest x-ray I independently visualized and interpreted imaging which showed unremarkable I agree with the radiologist interpretation   Cardiac Monitoring:  The patient was maintained on a cardiac monitor.  I personally viewed and interpreted the cardiac monitored which showed an underlying rhythm of: Normal sinus rhythm   Medicines ordered and prescription drug management:  I ordered medication including aspirin, heparin, morphine, nitroglycerin Reevaluation of the patient after these medicines showed that the patient stayed the same I have reviewed the patients home medicines and have made adjustments as needed   Test Considered:  Cardiac cath   Critical Interventions:  IV nitroglycerin and heparin   Consultations Obtained:  I requested consultation with the cardiology,  and discussed lab and imaging findings as well as pertinent plan - they recommend: Transfer patient to [facility] for [facility] admission   Problem List / ED Course:  Chest pain with elevated troponin consistent with an NSTEMI   Reevaluation:  After the interventions noted  above, I reevaluated the patient and found that they have :stayed the same   Social Determinants of Health:  None   Dispostion:  After consideration of the diagnostic results and the patients response to treatment, I feel that the patent would benefit from transfer to [facility] with cardiology consult and admission.         Final Clinical Impression(s) / ED Diagnoses Final diagnoses:  NSTEMI (non-ST elevated myocardial infarction) ([place])    Rx /  DC Orders ED Discharge Orders     None         Milton Ferguson, MD 05/18/21 [phone]

## 2021-05-18 NOTE — ED Notes (Signed)
Patient complains of chest pain 8/10 that starts in th left chest and radiates down his arm to his ear. Patient states last week he experienced numbness in his fingers, but does not complain of that today. Chest pain started shortly after eating. Patient has history of MI with stent placement and aortic aneurysm.

## 2021-05-18 NOTE — ED Notes (Signed)
Patient ambulated to the bathroom.

## 2021-05-18 NOTE — ED Triage Notes (Addendum)
Pt c/o left jaw pain that radiates down left arm that started about an hour ago. states he has had an MI in the past. Denies SOB. Pt took one nitro with no relief.

## 2021-05-19 ENCOUNTER — Inpatient Hospital Stay (HOSPITAL_COMMUNITY): Payer: Medicare HMO

## 2021-05-19 ENCOUNTER — Encounter (HOSPITAL_COMMUNITY): Payer: Self-pay | Admitting: Student in an Organized Health Care Education/Training Program

## 2021-05-19 ENCOUNTER — Encounter (HOSPITAL_COMMUNITY)
Admission: EM | Disposition: A | Discharge status: Home / Self Care | Payer: Self-pay | Source: Home / Self Care | Attending: Internal Medicine

## 2021-05-19 DIAGNOSIS — I10 Essential (primary) hypertension: Secondary | ICD-10-CM | POA: Diagnosis not present

## 2021-05-19 DIAGNOSIS — T82855A Stenosis of coronary artery stent, initial encounter: Secondary | ICD-10-CM | POA: Diagnosis not present

## 2021-05-19 DIAGNOSIS — I2510 Atherosclerotic heart disease of native coronary artery without angina pectoris: Secondary | ICD-10-CM

## 2021-05-19 DIAGNOSIS — Z79899 Other long term (current) drug therapy: Secondary | ICD-10-CM | POA: Diagnosis not present

## 2021-05-19 DIAGNOSIS — I7143 Infrarenal abdominal aortic aneurysm, without rupture: Secondary | ICD-10-CM | POA: Diagnosis not present

## 2021-05-19 DIAGNOSIS — I214 Non-ST elevation (NSTEMI) myocardial infarction: Secondary | ICD-10-CM

## 2021-05-19 DIAGNOSIS — Z885 Allergy status to narcotic agent status: Secondary | ICD-10-CM | POA: Diagnosis not present

## 2021-05-19 DIAGNOSIS — I252 Old myocardial infarction: Secondary | ICD-10-CM | POA: Diagnosis not present

## 2021-05-19 DIAGNOSIS — R7303 Prediabetes: Secondary | ICD-10-CM | POA: Diagnosis not present

## 2021-05-19 DIAGNOSIS — Z888 Allergy status to other drugs, medicaments and biological substances status: Secondary | ICD-10-CM | POA: Diagnosis not present

## 2021-05-19 DIAGNOSIS — I714 Abdominal aortic aneurysm, without rupture, unspecified: Secondary | ICD-10-CM

## 2021-05-19 DIAGNOSIS — R69 Illness, unspecified: Secondary | ICD-10-CM | POA: Diagnosis not present

## 2021-05-19 DIAGNOSIS — F1010 Alcohol abuse, uncomplicated: Secondary | ICD-10-CM | POA: Diagnosis present

## 2021-05-19 DIAGNOSIS — K219 Gastro-esophageal reflux disease without esophagitis: Secondary | ICD-10-CM | POA: Diagnosis not present

## 2021-05-19 DIAGNOSIS — R079 Chest pain, unspecified: Secondary | ICD-10-CM | POA: Diagnosis not present

## 2021-05-19 DIAGNOSIS — Y831 Surgical operation with implant of artificial internal device as the cause of abnormal reaction of the patient, or of later complication, without mention of misadventure at the time of the procedure: Secondary | ICD-10-CM | POA: Diagnosis not present

## 2021-05-19 DIAGNOSIS — E785 Hyperlipidemia, unspecified: Secondary | ICD-10-CM

## 2021-05-19 DIAGNOSIS — I25110 Atherosclerotic heart disease of native coronary artery with unstable angina pectoris: Secondary | ICD-10-CM | POA: Diagnosis not present

## 2021-05-19 DIAGNOSIS — Z881 Allergy status to other antibiotic agents status: Secondary | ICD-10-CM | POA: Diagnosis not present

## 2021-05-19 DIAGNOSIS — Z87891 Personal history of nicotine dependence: Secondary | ICD-10-CM | POA: Diagnosis not present

## 2021-05-19 DIAGNOSIS — Z20822 Contact with and (suspected) exposure to covid-19: Secondary | ICD-10-CM | POA: Diagnosis not present

## 2021-05-19 HISTORY — DX: Non-ST elevation (NSTEMI) myocardial infarction: I21.4

## 2021-05-19 HISTORY — PX: CORONARY STENT INTERVENTION: CATH118234

## 2021-05-19 HISTORY — PX: LEFT HEART CATH AND CORONARY ANGIOGRAPHY: CATH118249

## 2021-05-19 LAB — PROTIME-INR
INR: 1.1 (ref 0.8–1.2)
Prothrombin Time: 13.8 seconds (ref 11.4–15.2)

## 2021-05-19 LAB — CBC WITH DIFFERENTIAL/PLATELET
Abs Immature Granulocytes: 0.04 10*3/uL (ref 0.00–0.07)
Basophils Absolute: 0.1 10*3/uL (ref 0.0–0.1)
Basophils Relative: 1 %
Eosinophils Absolute: 0.4 10*3/uL (ref 0.0–0.5)
Eosinophils Relative: 3 %
HCT: 43.5 % (ref 39.0–52.0)
Hemoglobin: 13.9 g/dL (ref 13.0–17.0)
Immature Granulocytes: 0 %
Lymphocytes Relative: 23 %
Lymphs Abs: 2.7 10*3/uL (ref 0.7–4.0)
MCH: 30 pg (ref 26.0–34.0)
MCHC: 32 g/dL (ref 30.0–36.0)
MCV: 93.8 fL (ref 80.0–100.0)
Monocytes Absolute: 1 10*3/uL (ref 0.1–1.0)
Monocytes Relative: 8 %
Neutro Abs: 7.5 10*3/uL (ref 1.7–7.7)
Neutrophils Relative %: 65 %
Platelets: 187 10*3/uL (ref 150–400)
RBC: 4.64 MIL/uL (ref 4.22–5.81)
RDW: 13.8 % (ref 11.5–15.5)
WBC: 11.6 10*3/uL — ABNORMAL HIGH (ref 4.0–10.5)
nRBC: 0 % (ref 0.0–0.2)

## 2021-05-19 LAB — BASIC METABOLIC PANEL
Anion gap: 9 (ref 5–15)
BUN: 15 mg/dL (ref 8–23)
CO2: 25 mmol/L (ref 22–32)
Calcium: 9 mg/dL (ref 8.9–10.3)
Chloride: 104 mmol/L (ref 98–111)
Creatinine, Ser: 0.93 mg/dL (ref 0.61–1.24)
GFR, Estimated: >60 mL/min (ref >60)
Glucose, Bld: 112 mg/dL — ABNORMAL HIGH (ref 70–99)
Potassium: 4.1 mmol/L (ref 3.5–5.1)
Sodium: 138 mmol/L (ref 135–145)

## 2021-05-19 LAB — ECHOCARDIOGRAM COMPLETE
AR max vel: 2.68 cm2
AV Peak grad: 6.9 mmHg
Ao pk vel: 1.32 m/s
Area-P 1/2: 3.91 cm2
Height: 70 in
S' Lateral: 3 cm
Weight: 3114.66 oz

## 2021-05-19 LAB — LIPID PANEL
Cholesterol: 146 mg/dL (ref 0–200)
HDL: 45 mg/dL (ref >40)
LDL Cholesterol: 73 mg/dL (ref 0–99)
Total CHOL/HDL Ratio: 3.2 RATIO
Triglycerides: 140 mg/dL (ref <150)
VLDL: 28 mg/dL (ref 0–40)

## 2021-05-19 LAB — TYPE AND SCREEN
ABO/RH(D): A POS
Antibody Screen: NEGATIVE

## 2021-05-19 LAB — HEMOGLOBIN A1C
Hgb A1c MFr Bld: 5.7 % — ABNORMAL HIGH (ref 4.8–5.6)
Mean Plasma Glucose: 116.89 mg/dL

## 2021-05-19 LAB — SURGICAL PCR SCREEN
MRSA, PCR: NEGATIVE
Staphylococcus aureus: NEGATIVE

## 2021-05-19 LAB — TROPONIN I (HIGH SENSITIVITY): Troponin I (High Sensitivity): 55 ng/L — ABNORMAL HIGH (ref <18)

## 2021-05-19 LAB — TSH: TSH: 3.177 u[IU]/mL (ref 0.350–4.500)

## 2021-05-19 LAB — ABO/RH: ABO/RH(D): A POS

## 2021-05-19 LAB — HEPARIN LEVEL (UNFRACTIONATED): Heparin Unfractionated: 0.47 IU/mL (ref 0.30–0.70)

## 2021-05-19 LAB — APTT: aPTT: 82 seconds — ABNORMAL HIGH (ref 24–36)

## 2021-05-19 LAB — MAGNESIUM: Magnesium: 2.1 mg/dL (ref 1.7–2.4)

## 2021-05-19 SURGERY — LEFT HEART CATH AND CORONARY ANGIOGRAPHY
Anesthesia: LOCAL

## 2021-05-19 MED ORDER — ACETAMINOPHEN 325 MG PO TABS
650.0000 mg | ORAL_TABLET | ORAL | Status: DC | PRN
  Administered 2021-05-20: 650 mg via ORAL
  Filled 2021-05-19: qty 2

## 2021-05-19 MED ORDER — ACETAMINOPHEN 325 MG PO TABS: 650.0000 mg | ORAL_TABLET | ORAL | Status: DC | PRN

## 2021-05-19 MED ORDER — ATORVASTATIN CALCIUM 40 MG PO TABS
40.0000 mg | ORAL_TABLET | Freq: Every day | ORAL | Status: DC
  Administered 2021-05-19: 40 mg via ORAL
  Filled 2021-05-19: qty 1

## 2021-05-19 MED ORDER — SODIUM CHLORIDE 0.9 % IV SOLN
INTRAVENOUS | Status: AC | PRN
  Administered 2021-05-19: 20 mL/h via INTRAVENOUS

## 2021-05-19 MED ORDER — HEPARIN SODIUM (PORCINE) 1000 UNIT/ML IJ SOLN
INTRAMUSCULAR | Status: AC
  Filled 2021-05-19: qty 10

## 2021-05-19 MED ORDER — SODIUM CHLORIDE 0.9% FLUSH: 3.0000 mL | INTRAVENOUS | Status: DC | PRN

## 2021-05-19 MED ORDER — NITROGLYCERIN 1 MG/10 ML FOR IR/CATH LAB
INTRA_ARTERIAL | Status: AC
  Filled 2021-05-19: qty 10

## 2021-05-19 MED ORDER — FENTANYL CITRATE (PF) 100 MCG/2ML IJ SOLN
INTRAMUSCULAR | Status: DC | PRN
  Administered 2021-05-19 (×3): 25 ug via INTRAVENOUS

## 2021-05-19 MED ORDER — CLOPIDOGREL BISULFATE 300 MG PO TABS
ORAL_TABLET | ORAL | Status: DC | PRN
  Administered 2021-05-19: 600 mg via ORAL

## 2021-05-19 MED ORDER — ATORVASTATIN CALCIUM 80 MG PO TABS: 80.0000 mg | ORAL_TABLET | Freq: Every day | ORAL | Status: DC

## 2021-05-19 MED ORDER — SODIUM CHLORIDE 0.9 % WEIGHT BASED INFUSION: 1.0000 mL/kg/h | INTRAVENOUS | Status: DC

## 2021-05-19 MED ORDER — HEPARIN (PORCINE) IN NACL 1000-0.9 UT/500ML-% IV SOLN
INTRAVENOUS | Status: DC | PRN
  Administered 2021-05-19 (×2): 500 mL

## 2021-05-19 MED ORDER — HEPARIN SODIUM (PORCINE) 1000 UNIT/ML IJ SOLN
INTRAMUSCULAR | Status: DC | PRN
  Administered 2021-05-19: 5500 [IU] via INTRAVENOUS
  Administered 2021-05-19: 4500 [IU] via INTRAVENOUS
  Administered 2021-05-19: 2000 [IU] via INTRAVENOUS

## 2021-05-19 MED ORDER — MIDAZOLAM HCL 2 MG/2ML IJ SOLN
INTRAMUSCULAR | Status: AC
  Filled 2021-05-19: qty 2

## 2021-05-19 MED ORDER — VERAPAMIL HCL 2.5 MG/ML IV SOLN
INTRAVENOUS | Status: DC | PRN
  Administered 2021-05-19: 10 mL via INTRA_ARTERIAL

## 2021-05-19 MED ORDER — SODIUM CHLORIDE 0.9 % IV SOLN: 250.0000 mL | INTRAVENOUS | Status: DC | PRN

## 2021-05-19 MED ORDER — SODIUM CHLORIDE 0.9% FLUSH
3.0000 mL | Freq: Two times a day (BID) | INTRAVENOUS | Status: DC
  Administered 2021-05-19 – 2021-05-20 (×2): 3 mL via INTRAVENOUS

## 2021-05-19 MED ORDER — NITROGLYCERIN 1 MG/10 ML FOR IR/CATH LAB
INTRA_ARTERIAL | Status: DC | PRN
  Administered 2021-05-19: 200 ug

## 2021-05-19 MED ORDER — FENTANYL CITRATE (PF) 100 MCG/2ML IJ SOLN
INTRAMUSCULAR | Status: AC
  Filled 2021-05-19: qty 2

## 2021-05-19 MED ORDER — ASPIRIN 81 MG PO CHEW
81.0000 mg | CHEWABLE_TABLET | ORAL | Status: AC
  Administered 2021-05-19: 81 mg via ORAL
  Filled 2021-05-19: qty 1

## 2021-05-19 MED ORDER — LISINOPRIL 5 MG PO TABS
2.5000 mg | ORAL_TABLET | Freq: Every day | ORAL | Status: DC
  Administered 2021-05-19 – 2021-05-20 (×2): 2.5 mg via ORAL
  Filled 2021-05-19 (×4): qty 1

## 2021-05-19 MED ORDER — SODIUM CHLORIDE 0.9 % WEIGHT BASED INFUSION: 3.0000 mL/kg/h | INTRAVENOUS | Status: DC

## 2021-05-19 MED ORDER — ATORVASTATIN CALCIUM 80 MG PO TABS
80.0000 mg | ORAL_TABLET | Freq: Every day | ORAL | Status: DC
  Administered 2021-05-20: 80 mg via ORAL
  Filled 2021-05-19 (×2): qty 1

## 2021-05-19 MED ORDER — SODIUM CHLORIDE 0.9% FLUSH: 3.0000 mL | Freq: Two times a day (BID) | INTRAVENOUS | Status: DC

## 2021-05-19 MED ORDER — LABETALOL HCL 5 MG/ML IV SOLN: 10.0000 mg | INTRAVENOUS | Status: AC | PRN

## 2021-05-19 MED ORDER — ASPIRIN EC 81 MG PO TBEC
81.0000 mg | DELAYED_RELEASE_TABLET | Freq: Every day | ORAL | Status: DC
  Administered 2021-05-19 – 2021-05-20 (×2): 81 mg via ORAL
  Filled 2021-05-19 (×3): qty 1

## 2021-05-19 MED ORDER — LORAZEPAM 1 MG PO TABS
1.0000 mg | ORAL_TABLET | Freq: Once | ORAL | Status: AC
  Administered 2021-05-19: 1 mg via ORAL
  Filled 2021-05-19: qty 1

## 2021-05-19 MED ORDER — CLOPIDOGREL BISULFATE 75 MG PO TABS
75.0000 mg | ORAL_TABLET | Freq: Every day | ORAL | Status: DC
  Administered 2021-05-20: 75 mg via ORAL
  Filled 2021-05-19: qty 1

## 2021-05-19 MED ORDER — TRAZODONE HCL 50 MG PO TABS
50.0000 mg | ORAL_TABLET | Freq: Every day | ORAL | Status: DC
  Administered 2021-05-19: 50 mg via ORAL
  Filled 2021-05-19: qty 1

## 2021-05-19 MED ORDER — IOHEXOL 350 MG/ML SOLN
INTRAVENOUS | Status: DC | PRN
  Administered 2021-05-19: 150 mL

## 2021-05-19 MED ORDER — HEPARIN (PORCINE) IN NACL 1000-0.9 UT/500ML-% IV SOLN
INTRAVENOUS | Status: AC
  Filled 2021-05-19: qty 1000

## 2021-05-19 MED ORDER — HEPARIN SODIUM (PORCINE) 5000 UNIT/ML IJ SOLN
5000.0000 [IU] | Freq: Three times a day (TID) | INTRAMUSCULAR | Status: DC
  Administered 2021-05-19 – 2021-05-20 (×2): 5000 [IU] via SUBCUTANEOUS
  Filled 2021-05-19 (×2): qty 1

## 2021-05-19 MED ORDER — CLOPIDOGREL BISULFATE 300 MG PO TABS
ORAL_TABLET | ORAL | Status: AC
  Filled 2021-05-19: qty 2

## 2021-05-19 MED ORDER — SODIUM CHLORIDE 0.9 % IV SOLN: INTRAVENOUS | Status: AC

## 2021-05-19 MED ORDER — MIDAZOLAM HCL 2 MG/2ML IJ SOLN
INTRAMUSCULAR | Status: DC | PRN
  Administered 2021-05-19: 1 mg via INTRAVENOUS
  Administered 2021-05-19: .5 mg via INTRAVENOUS

## 2021-05-19 MED ORDER — LIDOCAINE HCL (PF) 1 % IJ SOLN
INTRAMUSCULAR | Status: DC | PRN
  Administered 2021-05-19: 2 mL

## 2021-05-19 MED ORDER — LIDOCAINE HCL (PF) 1 % IJ SOLN
INTRAMUSCULAR | Status: AC
  Filled 2021-05-19: qty 30

## 2021-05-19 MED ORDER — VERAPAMIL HCL 2.5 MG/ML IV SOLN
INTRAVENOUS | Status: AC
  Filled 2021-05-19: qty 2

## 2021-05-19 MED ORDER — PANTOPRAZOLE SODIUM 40 MG PO TBEC
40.0000 mg | DELAYED_RELEASE_TABLET | Freq: Every day | ORAL | Status: DC
  Administered 2021-05-19 – 2021-05-20 (×2): 40 mg via ORAL
  Filled 2021-05-19 (×2): qty 1

## 2021-05-19 MED ORDER — MUPIROCIN 2 % EX OINT: 1.0000 "application " | TOPICAL_OINTMENT | Freq: Two times a day (BID) | CUTANEOUS | Status: DC

## 2021-05-19 MED ORDER — METOPROLOL TARTRATE 25 MG PO TABS
25.0000 mg | ORAL_TABLET | Freq: Two times a day (BID) | ORAL | Status: DC
  Administered 2021-05-19 – 2021-05-20 (×3): 25 mg via ORAL
  Filled 2021-05-19 (×3): qty 1

## 2021-05-19 MED ORDER — HYDRALAZINE HCL 20 MG/ML IJ SOLN: 10.0000 mg | INTRAMUSCULAR | Status: AC | PRN

## 2021-05-19 MED ORDER — ASPIRIN 81 MG PO CHEW: 81.0000 mg | CHEWABLE_TABLET | Freq: Every day | ORAL | Status: DC

## 2021-05-19 MED ORDER — CLOPIDOGREL BISULFATE 300 MG PO TABS
ORAL_TABLET | ORAL | Status: AC
  Filled 2021-05-19: qty 1

## 2021-05-19 MED ORDER — FLUTICASONE PROPIONATE 50 MCG/ACT NA SUSP
1.0000 | Freq: Every day | NASAL | Status: DC
  Filled 2021-05-19: qty 16

## 2021-05-19 MED ORDER — ONDANSETRON HCL 4 MG/2ML IJ SOLN: 4.0000 mg | Freq: Four times a day (QID) | INTRAMUSCULAR | Status: DC | PRN

## 2021-05-19 MED ORDER — NITROGLYCERIN 0.4 MG SL SUBL: 0.4000 mg | SUBLINGUAL_TABLET | SUBLINGUAL | Status: DC | PRN

## 2021-05-19 SURGICAL SUPPLY — 21 items
BALLN SAPPHIRE 3.0X20 (BALLOONS) ×2
BALLN SAPPHIRE ~~LOC~~ 3.75X15 (BALLOONS) ×1 IMPLANT
BALLN SCOREFLEX 3.50X15 (BALLOONS) ×2
BALLOON SAPPHIRE 3.0X20 (BALLOONS) IMPLANT
BALLOON SCOREFLEX 3.50X15 (BALLOONS) IMPLANT
CATH 5FR JL3.5 JR4 ANG PIG MP (CATHETERS) ×1 IMPLANT
CATH LAUNCHER 6FR AL1 (CATHETERS) IMPLANT
CATH VISTA GUIDE 6FR JR4 (CATHETERS) ×1 IMPLANT
CATH VISTA GUIDE 6FR XBRCA (CATHETERS) ×1 IMPLANT
CATHETER LAUNCHER 6FR AL1 (CATHETERS) ×2
DEVICE RAD COMP TR BAND LRG (VASCULAR PRODUCTS) ×1 IMPLANT
GLIDESHEATH SLEND A-KIT 6F 22G (SHEATH) ×1 IMPLANT
GUIDEWIRE INQWIRE 1.5J.035X260 (WIRE) IMPLANT
INQWIRE 1.5J .035X260CM (WIRE) ×2
KIT ENCORE 26 ADVANTAGE (KITS) ×1 IMPLANT
KIT HEART LEFT (KITS) ×2 IMPLANT
PACK CARDIAC CATHETERIZATION (CUSTOM PROCEDURE TRAY) ×2 IMPLANT
STENT ONYX FRONTIER 3.5X38 (Permanent Stent) ×1 IMPLANT
TRANSDUCER W/STOPCOCK (MISCELLANEOUS) ×2 IMPLANT
TUBING CIL FLEX 10 FLL-RA (TUBING) ×2 IMPLANT
WIRE ASAHI PROWATER 180CM (WIRE) ×1 IMPLANT

## 2021-05-19 NOTE — H&P (Signed)
Cardiology Admission History and Physical:   Patient ID: Jeremy Jenkins MRN: [id number]; DOB: 04-01-1951   Admission date: 05/18/2021  PCP:  Lindell Spar, MD   [facility] Providers Cardiologist:  None        Chief Complaint: Chest pain  Patient Profile:   Jeremy Jenkins is a 71 y.o. male with STEMI s/p PCI to RCA (01/2011), HTN, HLD, AAA, prior tobacco abuse, prior EtOH abuse, and GERD who is being seen 05/19/2021 for the evaluation of chest pain.  History of Present Illness:   Jeremy Jenkins reports that yesterday he was in his usual state of health until he developed left-sided chest pain underneath his left armpit radiating to his shoulder and left neck.  He describes the pain as being a dull ache that is severe without alleviating or aggravating factors.  He reports that over the past few weeks he has had intermittent episodes of this arm/chest pain at rest, but nothing with exertion.  He also suffered a STEMI in 2012 and states that his current pain is similar to his prior MI pain albeit not as severe.  Back in 2012 he underwent PCI to the RCA and he has not had any issues from a cardiovascular standpoint since until now.  He took 2 sublingual nitroglycerin without alleviation of his pain so he called EMS and presented to the ED.  He is a prior heavy tobacco user but quit in 2020.  He also reports that he drinks only on occasion, but does have a history of EtOH abuse listed in his chart.  He denies illicit drug use.  He is retired and lives at home with his wife.  He denies syncope, presyncope, palpitations, PND, orthopnea, nausea, vomiting, abdominal pain, diarrhea, weakness, numbness.  In the ED his VS were afebrile, HR 66, BP 136/88, RR 15, satting 96% on RA.  Labs notable for WBC 11, and troponin 42 -> 55.  CXR WNL.  EKG showed NSR without ST segment changes.  He was admitted to cardiology for ongoing care.   Past Medical History:  Diagnosis Date   Old myocardial  infarction 09/11/2013   Formatting of this note might be different from the original. Overview:  01/2011 Formatting of this note might be different from the original. 01/2011    No past surgical history on file.   Medications Prior to Admission: Prior to Admission medications   Medication Sig Start Date End Date Taking? Authorizing Provider  atorvastatin (LIPITOR) 40 MG tablet Take 1 tablet (40 mg total) by mouth daily. 04/20/21  Yes Lindell Spar, MD  ergocalciferol (VITAMIN D2) 1.25 MG (50000 UT) capsule Take by mouth.   Yes [provider]  fluticasone (FLONASE) 50 MCG/ACT nasal spray Place 1 spray into both nostrils daily. 04/03/21  Yes [provider]  magnesium 30 MG tablet Take 30 mg by mouth daily.   Yes [provider]  metoprolol succinate (TOPROL-XL) 50 MG 24 hr tablet Take 50 mg by mouth daily. Take with or immediately following a meal.   Yes [provider]  nitroGLYCERIN (NITROSTAT) 0.4 MG SL tablet Place 0.4 mg under the tongue every 5 (five) minutes as needed for chest pain. 04/03/21  Yes [provider]  omeprazole (PRILOSEC) 40 MG capsule Take 40 mg by mouth daily. 04/03/21  Yes [provider]  traZODone (DESYREL) 50 MG tablet Take 1 tablet (50 mg total) by mouth at bedtime. 04/20/21  Yes Lindell Spar, MD  metoprolol succinate (  TOPROL-XL) 25 MG 24 hr tablet Take 1 tablet (25 mg total) by mouth daily. Patient not taking: Reported on 05/19/2021 04/20/21   Lindell Spar, MD  sildenafil (REVATIO) 20 MG tablet Take 20 mg by mouth as needed. Patient not taking: Reported on 05/19/2021 12/06/20   [provider]     Allergies:    Allergies  Allergen Reactions   Albuterol Anaphylaxis   Azithromycin Tinitus    Hearing loss    Penicillins Shortness Of Breath   Tadalafil Shortness Of Breath   Aspartame And Phenylalanine Other (See Comments)    HEADACHES HEADACHES HEADACHES HEADACHES    Topiramate Other (See  Comments)    Memory loss Memory loss Memory loss    Buspirone Other (See Comments)    Memory loss Memory loss Memory loss Memory loss    Hydrocodone Other (See Comments)    Tingling all over Tingling all over Tingling all over     Social History:   Social History   Socioeconomic History   Marital status: Single    Spouse name: Not on file   Number of children: Not on file   Years of education: Not on file   Highest education level: Not on file  Occupational History   Not on file  Tobacco Use   Smoking status: Former    Types: Cigarettes    Quit date: 06/02/2018    Years since quitting: 2.9   Smokeless tobacco: Never  Substance and Sexual Activity   Alcohol use: Not Currently   Drug use: Never   Sexual activity: Not on file  Other Topics Concern   Not on file  Social History Narrative   Not on file   Social Determinants of Health   Financial Resource Strain: Not on file  Food Insecurity: Not on file  Transportation Needs: Not on file  Physical Activity: Not on file  Stress: Not on file  Social Connections: Not on file  Intimate Partner Violence: Not on file    Family History:   The patient's family history is not on file.    ROS:  Please see the history of present illness.  All other ROS reviewed and negative.     Physical Exam/Data:   Vitals:   05/18/21 2317 05/18/21 2345 05/19/21 0100 05/19/21 0230  BP:  (!) 149/83 (!) 141/92 136/88  Pulse:  71 76 66  Resp:  14 15 15   Temp: 98.6 F (37 C)     TempSrc: Oral     SpO2:  98% 97% 96%  Weight:      Height:       No intake or output data in the 24 hours ending 05/19/21 0306 Last 3 Weights 05/18/2021 05/06/2021 04/20/2021  Weight (lbs) 195 lb 194 lb 194 lb 0.6 oz  Weight (kg) 88.451 kg 87.998 kg 88.016 kg     Body mass index is 27.98 kg/m.  General:  Well nourished, well developed, in no acute distress, very pleasant HEENT: normal, atraumatic, normocephalic, edentulous Neck: no JVD Vascular: No  carotid bruits; Distal pulses 2+ bilaterally   Cardiac:  normal S1, S2; RRR; no murmur  Lungs:  clear to auscultation bilaterally, no wheezing, rhonchi or rales  Abd: soft, nontender, no hepatomegaly  Ext: no edema Musculoskeletal:  No deformities, BUE and BLE strength normal and equal Skin: warm and dry  Neuro:  CNs 2-12 intact, no focal abnormalities noted Psych:  Normal affect    EKG:  The ECG that was done was personally  reviewed and demonstrates NSR    Relevant CV Studies:  Abd U/S 05/06/21:  Summary:  Abdominal Aorta: There is evidence of abnormal dilatation of the mid  Abdominal aorta. The largest aortic diameter has increased compared to  prior exam. Previous diameter measurement was 3.8 cm obtained on 08/21/17  by CTA.    TTE 08/22/17:  Interpretation Summary  A complete portable two-dimensional transthoracic echocardiogram with color  flow Doppler and Spectral Doppler was performed. The left ventricle is  normal in size.  There is mild concentric left ventricular hypertrophy.  The left ventricular ejection fraction is normal (60-65%).  The left ventricular wall motion is normal.  Mild aortic sclerosis is present with good valvular opening.  There is mild aortic root dilatation.  The aortic valve is trileaflet.  Grade I mild diastolic dysfunction; abnormal relaxation pattern.  Estimation of right ventricular systolic pressure is not possible.    LHC 01/16/11:  CONCLUSIONS:  1.  Single vessel obstructive coronary artery disease in the setting of an  ST elevation myocardial infarction.  2.  Status post PCI to the proximal and mid RCA with a 3.0 x 28 and 3.5 x  12 mm vision bare-metal stents.  Lesion length was 30 mm, eccentric, mild  calcium, definite thrombus.  Lesion went from a 100% pre to 0% post  residual.  Pre-TIMI flow was 0, post-TIMI flow was 3.  3.  Right radial access.  4.  Preserved left ventricular function.   Laboratory Data:  High Sensitivity  Troponin:   Recent Labs  Lab 05/18/21 2137 05/18/21 2334  TROPONINIHS 42* 55*      Chemistry Recent Labs  Lab 05/18/21 2137  NA 139  K 3.6  CL 100  CO2 26  GLUCOSE 130*  BUN 18  CREATININE 1.11  CALCIUM 9.2  GFRNONAA >60  ANIONGAP 13    Recent Labs  Lab 05/18/21 2135  PROT 7.2  ALBUMIN 4.1  AST 17  ALT 9  ALKPHOS 89  BILITOT 0.9   Lipids No results for input(s): CHOL, TRIG, HDL, LABVLDL, LDLCALC, CHOLHDL in the last 168 hours. Hematology Recent Labs  Lab 05/18/21 2137  WBC 11.0*  RBC 4.75  HGB 14.4  HCT 45.5  MCV 95.8  MCH 30.3  MCHC 31.6  RDW 13.7  PLT 206   Thyroid No results for input(s): TSH, FREET4 in the last 168 hours. BNPNo results for input(s): BNP, PROBNP in the last 168 hours.  DDimer No results for input(s): DDIMER in the last 168 hours.   Radiology/Studies:  DG Chest Port 1 View  Result Date: 05/18/2021 CLINICAL DATA:  Chest pain EXAM: PORTABLE CHEST 1 VIEW COMPARISON:  09/10/2017 FINDINGS: Heart and mediastinal contours are within normal limits. No focal opacities or effusions. No acute bony abnormality. IMPRESSION: No active disease. Electronically Signed   By: Rolm Baptise M.D.   On: 05/18/2021 22:30     Assessment and Plan:   Jeremy Jenkins is a 71 y.o. male with STEMI s/p PCI to RCA (01/2011), HTN, HLD, AAA, prior tobacco abuse, prior EtOH abuse, and GERD who is being seen 05/19/2021 for the evaluation of NSTEMI.  #NSTEMI, Likely Type I #CAD :: Presenting with left sided chest and arm pain with elevated troponins.  Chest pain similar to his prior MI.  Given his risk factors and laboratory evidence of myocardial injury, will treat for a likely type I NSTEMI. -LHC in a.m. -NPO at [place] -Heparin per pharmacy -ASA 81 mg daily -Fractionate home  metoprolol to tartrate 25 twice daily -Start lisinopril 2.5 mg daily -Continue home atorvastatin 40 mg daily -TTE -Maintain telemetry. -Sublingual nitroglycerin. -Check lipid panel, A1c,  TSH  #HTN #HLD -Statin, beta-blocker, lisinopril as above -Lipid panel as above  #AAA :: Recent ultrasound showed diameter increased to 4.8 cm.  Currently asymptomatic from an AAA standpoint.  He was told he needs good blood pressure control and close monitoring with repeat ultrasound as an outpatient.  #ETOH Abuse :: No active signs of withdrawal currently and he states that he only drinks occasionally.  We will need to monitor for any evidence of hemodynamic instability.   Risk Assessment/Risk Scores:    TIMI Risk Score for Unstable Angina or Non-ST Elevation MI:   The patient's TIMI risk score is 6, which indicates a 41% risk of all cause mortality, new or recurrent myocardial infarction or need for urgent revascularization in the next 14 days.       Severity of Illness: The appropriate patient status for this patient is INPATIENT. Inpatient status is judged to be reasonable and necessary in order to provide the required intensity of service to ensure the patient's safety. The patient's presenting symptoms, physical exam findings, and initial radiographic and laboratory data in the context of their chronic comorbidities is felt to place them at high risk for further clinical deterioration. Furthermore, it is not anticipated that the patient will be medically stable for discharge from the hospital within 2 midnights of admission.   * I certify that at the point of admission it is my clinical judgment that the patient will require inpatient hospital care spanning beyond 2 midnights from the point of admission due to high intensity of service, high risk for further deterioration and high frequency of surveillance required.*   For questions or updates, please contact [place] Please consult [email or web address] for contact info under     Signed, Hershal Coria, MD  05/19/2021 3:06 AM

## 2021-05-19 NOTE — Progress Notes (Signed)
ANTICOAGULATION CONSULT NOTE - Initial Consult  Pharmacy Consult for Heparin  Indication: chest pain/ACS  Allergies  Allergen Reactions   Albuterol Anaphylaxis   Azithromycin Tinitus    Hearing loss    Penicillins Shortness Of Breath   Tadalafil Shortness Of Breath   Aspartame And Phenylalanine Other (See Comments)    HEADACHES HEADACHES HEADACHES HEADACHES    Topiramate Other (See Comments)    Memory loss Memory loss Memory loss    Buspirone Other (See Comments)    Memory loss Memory loss Memory loss Memory loss    Hydrocodone Other (See Comments)    Tingling all over Tingling all over Tingling all over     Patient Measurements: Height: 5\' 10"  (177.8 cm) Weight: 88.5 kg (195 lb) IBW/kg (Calculated) : 73  Vital Signs: Temp: 98.6 F (37 C) (02/13 2317) Temp Source: Oral (02/13 2317) BP: 141/92 (02/14 0100) Pulse Rate: 76 (02/14 0100)  Labs: Recent Labs    05/18/21 2137 05/18/21 2334  HGB 14.4  --   HCT 45.5  --   PLT 206  --   CREATININE 1.11  --   TROPONINIHS 42* 55*    Estimated Creatinine Clearance: 68.4 mL/min (by C-G formula based on SCr of 1.11 mg/dL).   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 Overview:  01/2011 Formatting of this note might be different from the original. 01/2011    Assessment: 71 y/o M ED to ED [facility] to [facility] for chest pain. Starting heparin. CBC/renal function good. PTA meds reviewed.   Goal of Therapy:  Heparin level 0.3-0.7 units/ml Monitor platelets by anticoagulation protocol: Yes   Plan:  Heparin 4000 units BOLUS Start heparin drip at 1200 units/hr 0700 Heparin level Daily CBC/Heparin level Monitor for bleeding  Narda Bonds, PharmD, BCPS Clinical Pharmacist Phone: [phone]

## 2021-05-19 NOTE — Interval H&P Note (Signed)
Cath Lab Visit (complete for each Cath Lab visit)  Clinical Evaluation Leading to the Procedure:   ACS: Yes.    Non-ACS:    Anginal Classification: CCS III  Anti-ischemic medical therapy: No Therapy  Non-Invasive Test Results: No non-invasive testing performed  Prior CABG: No previous CABG      History and Physical Interval Note:  05/19/2021 11:54 AM  Jeremy Jenkins  has presented today for surgery, with the diagnosis of unstable angina.  The various methods of treatment have been discussed with the patient and family. After consideration of risks, benefits and other options for treatment, the patient has consented to  Procedure(s): LEFT HEART CATH AND CORONARY ANGIOGRAPHY (N/A) as a surgical intervention.  The patient's history has been reviewed, patient examined, no change in status, stable for surgery.  I have reviewed the patient's chart and labs.  Questions were answered to the patient's satisfaction.     Belva Crome III

## 2021-05-19 NOTE — H&P (View-Only) (Signed)
Progress Note  Patient Name: Jeremy Jenkins Date of Encounter: 05/19/2021  [facility] Cardiologist: None   Subjective   No pain this morning. Planned for cardiac cath today.   Inpatient Medications    Scheduled Meds:  aspirin EC  81 mg Oral Daily   atorvastatin  40 mg Oral Daily   fluticasone  1 spray Each Nare Daily   lisinopril  2.5 mg Oral Daily   metoprolol tartrate  25 mg Oral BID   pantoprazole  40 mg Oral Daily   traZODone  50 mg Oral QHS   Continuous Infusions:  heparin 1,200 Units/hr (05/18/21 2308)   PRN Meds: acetaminophen, nitroGLYCERIN   Vital Signs    Vitals:   05/19/21 0530 05/19/21 0600 05/19/21 0649 05/19/21 0758  BP: 120/82 115/74 (!) 151/92 133/83  Pulse: 73 69 63 67  Resp: 18 16 18 20   Temp:  98.5 F (36.9 C) 98.1 F (36.7 C) 98.1 F (36.7 C)  TempSrc:  Oral Oral Oral  SpO2: 94% 96% 100% 97%  Weight:   88.3 kg   Height:   5\' 10"  (1.778 m)    No intake or output data in the 24 hours ending 05/19/21 0834 Last 3 Weights 05/19/2021 05/18/2021 05/06/2021  Weight (lbs) 194 lb 10.7 oz 195 lb 194 lb  Weight (kg) 88.3 kg 88.451 kg 87.998 kg      Telemetry    SR - Personally Reviewed  ECG    No new tracing this morning  Physical Exam   GEN: No acute distress.   Neck: No JVD Cardiac: RRR, no murmurs, rubs, or gallops.  Respiratory: Clear to auscultation bilaterally. GI: Soft, nontender, non-distended  MS: No edema; No deformity. Neuro:  Nonfocal  Psych: Normal affect   Labs    High Sensitivity Troponin:   Recent Labs  Lab 05/18/21 2137 05/18/21 2334  TROPONINIHS 42* 55*     Chemistry Recent Labs  Lab 05/18/21 2135 05/18/21 2137 05/19/21 0408  NA  --  139 138  K  --  3.6 4.1  CL  --  100 104  CO2  --  26 25  GLUCOSE  --  130* 112*  BUN  --  18 15  CREATININE  --  1.11 0.93  CALCIUM  --  9.2 9.0  MG  --   --  2.1  PROT 7.2  --   --   ALBUMIN 4.1  --   --   AST 17  --   --   ALT 9  --   --   ALKPHOS 89  --    --   BILITOT 0.9  --   --   GFRNONAA  --  >60 >60  ANIONGAP  --  13 9    Lipids  Recent Labs  Lab 05/19/21 0408  CHOL 146  TRIG 140  HDL 45  LDLCALC 73  CHOLHDL 3.2    Hematology Recent Labs  Lab 05/18/21 2137 05/19/21 0408  WBC 11.0* 11.6*  RBC 4.75 4.64  HGB 14.4 13.9  HCT 45.5 43.5  MCV 95.8 93.8  MCH 30.3 30.0  MCHC 31.6 32.0  RDW 13.7 13.8  PLT 206 187   Thyroid  Recent Labs  Lab 05/19/21 0408  TSH 3.177    BNPNo results for input(s): BNP, PROBNP in the last 168 hours.  DDimer No results for input(s): DDIMER in the last 168 hours.   Radiology    DG Chest [facility] 1 View  Result  Date: 05/18/2021 CLINICAL DATA:  Chest pain EXAM: PORTABLE CHEST 1 VIEW COMPARISON:  09/10/2017 FINDINGS: Heart and mediastinal contours are within normal limits. No focal opacities or effusions. No acute bony abnormality. IMPRESSION: No active disease. Electronically Signed   By: Jeremy Jenkins M.D.   On: 05/18/2021 22:30    Cardiac Studies   Echo: pending  Patient Profile     71 y.o. male with PMH of STEMI s/p PCI to RCA (01/2011), HTN, HLD, AAA, prior tobacco abuse, prior EtOH abuse, and GERD who was seen 05/19/2021 for the evaluation of chest pain.  Assessment & Plan    NSTEMI with CAD s/p RCA stenting '12: hsTn 42>>55, presented with left sided  arm pain similar to what he experienced with previous stenting. Planned for cardiac cath today.  -- on IV heparin, ASA, statin, BB, lisinopril  -- echo pending  Shared Decision Making/Informed Consent The risks [stroke (1 in 1000), death (1 in 1000), kidney failure [usually temporary] (1 in 500), bleeding (1 in 200), allergic reaction [possibly serious] (1 in 200)], benefits (diagnostic support and management of coronary artery disease) and alternatives of a cardiac catheterization were discussed in detail with Jeremy Jenkins and he is willing to proceed.   HTN: continue lisinopril, metoprolol   HLD: LDL 73 -- increase atorvastatin to  80mg    AAA: Recent ultrasound showed diameter increased to 4.8 cm.  Currently asymptomatic from an AAA standpoint. Seen by Jeremy Jenkins as an outpatient. Needs good blood pressure control and close monitoring with repeat ultrasound as an outpatient.  ETOH use: no signs of withdrawal   For questions or updates, please contact [place] Please consult [email or web address] for contact info under        Signed, Jeremy Bellis, NP  05/19/2021, 8:34 AM     Patient seen and examined. Agree with assessment and plan. Patient has experienced similar symptoms that he had felt prior to his remote intervention.  ECG shows sinus rhythm at 86 bpm without significant ST-T abnormalities.  He denies chest pain but his discomfort is down the medial aspect of his left upper arm. Troponins are minimally increased at 55.  His blood pressure is stable.  With his documented abdominal aortic aneurysm with recent diameter at 4.8 cm, need optimal blood pressure control with ideal blood pressure less than 120/80.  We will increase atorvastatin to 80 mg for more aggressive lipid management.  Patient was aware of the benefits of cardiac catheterization but not aware of the risks.  I have reviewed the risks, indications, and alternatives to cardiac catheterization, possible angioplasty, and stenting with the patient. Risks include but are not limited to bleeding, infection, vascular injury, stroke, myocardial infection, arrhythmia, kidney injury, radiation-related injury in the case of prolonged fluoroscopy use, emergency cardiac surgery, and death. The patient understands the risks of serious complication is 1-2 in 8280 with diagnostic cardiac cath and 1-2% or less with angioplasty/stenting.  Plan cath later today.    Jeremy Sine, MD, [facility] 05/19/2021 10:56 AM

## 2021-05-19 NOTE — ED Provider Notes (Signed)
Assumed care of patient as transfer from [facility] emergency department for NSTEMI.  On my evaluation patient states that he did have mild recurrence of pain in route but this is since resolved, he is pain-free at this time.  01:17: CONSULT: Discussed with Dr. Conley Canal with cardiology, will see patient and place admission orders.   Leafy Kindle 05/19/21 0123    Maudie Flakes, MD 05/19/21 [phone]

## 2021-05-19 NOTE — Progress Notes (Signed)
ANTICOAGULATION CONSULT NOTE - Initial Consult  Pharmacy Consult for Heparin  Indication: chest pain/ACS  Allergies  Allergen Reactions   Albuterol Anaphylaxis   Azithromycin Tinitus    Hearing loss    Penicillins Shortness Of Breath   Tadalafil Shortness Of Breath   Aspartame And Phenylalanine Other (See Comments)    HEADACHES HEADACHES HEADACHES HEADACHES    Topiramate Other (See Comments)    Memory loss Memory loss Memory loss    Buspirone Other (See Comments)    Memory loss Memory loss Memory loss Memory loss    Hydrocodone Other (See Comments)    Tingling all over Tingling all over Tingling all over     Patient Measurements: Height: 5\' 10"  (177.8 cm) Weight: 88.3 kg (194 lb 10.7 oz) IBW/kg (Calculated) : 73  Vital Signs: Temp: 98.1 F (36.7 C) (02/14 0758) Temp Source: Oral (02/14 0758) BP: 133/83 (02/14 0758) Pulse Rate: 67 (02/14 0758)  Labs: Recent Labs    05/18/21 2137 05/18/21 2334 05/19/21 0408 05/19/21 0714  HGB 14.4  --  13.9  --   HCT 45.5  --  43.5  --   PLT 206  --  187  --   APTT  --   --  82*  --   LABPROT  --   --  13.8  --   INR  --   --  1.1  --   HEPARINUNFRC  --   --   --  0.47  CREATININE 1.11  --  0.93  --   TROPONINIHS 42* 55*  --   --      Estimated Creatinine Clearance: 81.5 mL/min (by C-G formula based on SCr of 0.93 mg/dL).   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 Overview:  01/2011 Formatting of this note might be different from the original. 01/2011    Assessment: 71 y/o male ED transfer from [facility] to [facility] for chest pain. On heparin with plans for LHC today (05/19/20). CBC/renal function good. HL is therapeutic (0.47) at current rate.  Goal of Therapy:  Heparin level 0.3-0.7 units/ml Monitor platelets by anticoagulation protocol: Yes   Plan:  Continue heparin drip at 1200 units/hr Heparin  level in 6 hours, daily CBC Monitor for s/sx of bleeding and infusion issues  Debria Garret, Student Pharmacist  8:52 AM, 05/19/20

## 2021-05-19 NOTE — Progress Notes (Signed)
°  Transition of Care [facility]) Screening Note   Patient Details  Name: Jeremy Jenkins Date of Birth: June 01, 1950   Transition of Care [facility]) CM/SW Contact:    Milas Gain, [place] Phone Number: 05/19/2021, 3:44 PM    Transition of Care Department [facility]) has reviewed patient and no TOC needs have been identified at this time. We will continue to monitor patient advancement through interdisciplinary progression rounds. If new patient transition needs arise, please place a TOC consult.

## 2021-05-19 NOTE — Progress Notes (Addendum)
Progress Note  Patient Name: Jeremy Jenkins Date of Encounter: 05/19/2021  [facility] Cardiologist: None   Subjective   No pain this morning. Planned for cardiac cath today.   Inpatient Medications    Scheduled Meds:  aspirin EC  81 mg Oral Daily   atorvastatin  40 mg Oral Daily   fluticasone  1 spray Each Nare Daily   lisinopril  2.5 mg Oral Daily   metoprolol tartrate  25 mg Oral BID   pantoprazole  40 mg Oral Daily   traZODone  50 mg Oral QHS   Continuous Infusions:  heparin 1,200 Units/hr (05/18/21 2308)   PRN Meds: acetaminophen, nitroGLYCERIN   Vital Signs    Vitals:   05/19/21 0530 05/19/21 0600 05/19/21 0649 05/19/21 0758  BP: 120/82 115/74 (!) 151/92 133/83  Pulse: 73 69 63 67  Resp: 18 16 18 20   Temp:  98.5 F (36.9 C) 98.1 F (36.7 C) 98.1 F (36.7 C)  TempSrc:  Oral Oral Oral  SpO2: 94% 96% 100% 97%  Weight:   88.3 kg   Height:   5\' 10"  (1.778 m)    No intake or output data in the 24 hours ending 05/19/21 0834 Last 3 Weights 05/19/2021 05/18/2021 05/06/2021  Weight (lbs) 194 lb 10.7 oz 195 lb 194 lb  Weight (kg) 88.3 kg 88.451 kg 87.998 kg      Telemetry    SR - Personally Reviewed  ECG    No new tracing this morning  Physical Exam   GEN: No acute distress.   Neck: No JVD Cardiac: RRR, no murmurs, rubs, or gallops.  Respiratory: Clear to auscultation bilaterally. GI: Soft, nontender, non-distended  MS: No edema; No deformity. Neuro:  Nonfocal  Psych: Normal affect   Labs    High Sensitivity Troponin:   Recent Labs  Lab 05/18/21 2137 05/18/21 2334  TROPONINIHS 42* 55*     Chemistry Recent Labs  Lab 05/18/21 2135 05/18/21 2137 05/19/21 0408  NA  --  139 138  K  --  3.6 4.1  CL  --  100 104  CO2  --  26 25  GLUCOSE  --  130* 112*  BUN  --  18 15  CREATININE  --  1.11 0.93  CALCIUM  --  9.2 9.0  MG  --   --  2.1  PROT 7.2  --   --   ALBUMIN 4.1  --   --   AST 17  --   --   ALT 9  --   --   ALKPHOS 89  --    --   BILITOT 0.9  --   --   GFRNONAA  --  >60 >60  ANIONGAP  --  13 9    Lipids  Recent Labs  Lab 05/19/21 0408  CHOL 146  TRIG 140  HDL 45  LDLCALC 73  CHOLHDL 3.2    Hematology Recent Labs  Lab 05/18/21 2137 05/19/21 0408  WBC 11.0* 11.6*  RBC 4.75 4.64  HGB 14.4 13.9  HCT 45.5 43.5  MCV 95.8 93.8  MCH 30.3 30.0  MCHC 31.6 32.0  RDW 13.7 13.8  PLT 206 187   Thyroid  Recent Labs  Lab 05/19/21 0408  TSH 3.177    BNPNo results for input(s): BNP, PROBNP in the last 168 hours.  DDimer No results for input(s): DDIMER in the last 168 hours.   Radiology    DG Chest [facility] 1 View  Result  Date: 05/18/2021 CLINICAL DATA:  Chest pain EXAM: PORTABLE CHEST 1 VIEW COMPARISON:  09/10/2017 FINDINGS: Heart and mediastinal contours are within normal limits. No focal opacities or effusions. No acute bony abnormality. IMPRESSION: No active disease. Electronically Signed   By: Rolm Baptise M.D.   On: 05/18/2021 22:30    Cardiac Studies   Echo: pending  Patient Profile     71 y.o. male with PMH of STEMI s/p PCI to RCA (01/2011), HTN, HLD, AAA, prior tobacco abuse, prior EtOH abuse, and GERD who was seen 05/19/2021 for the evaluation of chest pain.  Assessment & Plan    NSTEMI with CAD s/p RCA stenting '12: hsTn 42>>55, presented with left sided  arm pain similar to what he experienced with previous stenting. Planned for cardiac cath today.  -- on IV heparin, ASA, statin, BB, lisinopril  -- echo pending  Shared Decision Making/Informed Consent The risks [stroke (1 in 1000), death (1 in 1000), kidney failure [usually temporary] (1 in 500), bleeding (1 in 200), allergic reaction [possibly serious] (1 in 200)], benefits (diagnostic support and management of coronary artery disease) and alternatives of a cardiac catheterization were discussed in detail with Jeremy Jenkins and he is willing to proceed.   HTN: continue lisinopril, metoprolol   HLD: LDL 73 -- increase atorvastatin to  80mg    AAA: Recent ultrasound showed diameter increased to 4.8 cm.  Currently asymptomatic from an AAA standpoint. Seen by Dr. Donnetta Hutching as an outpatient. Needs good blood pressure control and close monitoring with repeat ultrasound as an outpatient.  ETOH use: no signs of withdrawal   For questions or updates, please contact [place] Please consult [email or web address] for contact info under        Signed, Reino Bellis, NP  05/19/2021, 8:34 AM     Patient seen and examined. Agree with assessment and plan. Patient has experienced similar symptoms that he had felt prior to his remote intervention.  ECG shows sinus rhythm at 86 bpm without significant ST-T abnormalities.  He denies chest pain but his discomfort is down the medial aspect of his left upper arm. Troponins are minimally increased at 55.  His blood pressure is stable.  With his documented abdominal aortic aneurysm with recent diameter at 4.8 cm, need optimal blood pressure control with ideal blood pressure less than 120/80.  We will increase atorvastatin to 80 mg for more aggressive lipid management.  Patient was aware of the benefits of cardiac catheterization but not aware of the risks.  I have reviewed the risks, indications, and alternatives to cardiac catheterization, possible angioplasty, and stenting with the patient. Risks include but are not limited to bleeding, infection, vascular injury, stroke, myocardial infection, arrhythmia, kidney injury, radiation-related injury in the case of prolonged fluoroscopy use, emergency cardiac surgery, and death. The patient understands the risks of serious complication is 1-2 in 0488 with diagnostic cardiac cath and 1-2% or less with angioplasty/stenting.  Plan cath later today.    Troy Sine, MD, [facility] 05/19/2021 10:56 AM

## 2021-05-19 NOTE — ED Triage Notes (Signed)
Pt transferred to [facility] from [facility] for admit for NSTEMI and cath tomorrow. Arrived with Heparin at 12. A&Ox4, GCS 15. Ambulated from stretcher to bed. Transfer team states patient experienced 4/10 chest pain enroute, pain is 0/10 at time of arrival.

## 2021-05-19 NOTE — Progress Notes (Signed)
Echocardiogram 2D Echocardiogram has been performed.  Jeremy Jenkins 05/19/2021, 10:40 AM

## 2021-05-19 NOTE — Progress Notes (Signed)
Pt. Arrived via stretcher from ED. GCS-15 with VSS. Heparin infusing into Lt. Hand PIV @1200  units/hr. No c/o pain at present time. CCMD notified and pt. Is in NSR on monitor. Call bell with in reach with all questions answered. Will continue to monitor closely.

## 2021-05-19 NOTE — Progress Notes (Signed)
Obtained consent form. MRSA and staph aureus swaps performed.   Lavenia Atlas, RN

## 2021-05-19 NOTE — Progress Notes (Addendum)
Pt called this RN to room and advised he feels sweaty and nervous. Vital signs stable and pt denies any chest pain. Pt requested something for his "nerves". Pt advised that he does not normally take anything for anxiety but has taken Ativan in the past. Text paged MD to request. Waiting response.  Received order for 1 mg Ativan

## 2021-05-19 NOTE — ED Notes (Signed)
[facility] is here for transport.  

## 2021-05-19 NOTE — CV Procedure (Signed)
In-stent restenosis mid to distal previously placed right coronary stent, 95% at worst spot. Angioplasty, scoring balloon angioplasty, followed by placement of a 38 x 3.5 mm Onyx stent reducing the area of stenosis to less than 0%. Moderate mid circumflex irregularities. Widely patent left main. Normal LV function.  EF 55%.  Normal LVEDP.

## 2021-05-20 ENCOUNTER — Encounter (HOSPITAL_COMMUNITY): Payer: Self-pay | Admitting: Interventional Cardiology

## 2021-05-20 DIAGNOSIS — I214 Non-ST elevation (NSTEMI) myocardial infarction: Secondary | ICD-10-CM | POA: Diagnosis not present

## 2021-05-20 LAB — POCT ACTIVATED CLOTTING TIME
Activated Clotting Time: 311 seconds
Activated Clotting Time: 317 seconds
Activated Clotting Time: 335 seconds

## 2021-05-20 LAB — BASIC METABOLIC PANEL
Anion gap: 7 (ref 5–15)
BUN: 14 mg/dL (ref 8–23)
CO2: 25 mmol/L (ref 22–32)
Calcium: 8.5 mg/dL — ABNORMAL LOW (ref 8.9–10.3)
Chloride: 106 mmol/L (ref 98–111)
Creatinine, Ser: 0.93 mg/dL (ref 0.61–1.24)
GFR, Estimated: >60 mL/min (ref >60)
Glucose, Bld: 102 mg/dL — ABNORMAL HIGH (ref 70–99)
Potassium: 4.1 mmol/L (ref 3.5–5.1)
Sodium: 138 mmol/L (ref 135–145)

## 2021-05-20 LAB — CBC
HCT: 39.5 % (ref 39.0–52.0)
Hemoglobin: 12.7 g/dL — ABNORMAL LOW (ref 13.0–17.0)
MCH: 30.3 pg (ref 26.0–34.0)
MCHC: 32.2 g/dL (ref 30.0–36.0)
MCV: 94.3 fL (ref 80.0–100.0)
Platelets: 163 10*3/uL (ref 150–400)
RBC: 4.19 MIL/uL — ABNORMAL LOW (ref 4.22–5.81)
RDW: 14 % (ref 11.5–15.5)
WBC: 10.6 10*3/uL — ABNORMAL HIGH (ref 4.0–10.5)
nRBC: 0 % (ref 0.0–0.2)

## 2021-05-20 MED ORDER — ASPIRIN 81 MG PO TBEC: 81.0000 mg | DELAYED_RELEASE_TABLET | Freq: Every day | ORAL | 2 refills | Status: DC

## 2021-05-20 MED ORDER — PANTOPRAZOLE SODIUM 40 MG PO TBEC: 40.0000 mg | DELAYED_RELEASE_TABLET | Freq: Every day | ORAL | 0 refills | Status: DC

## 2021-05-20 MED ORDER — ATORVASTATIN CALCIUM 80 MG PO TABS: 80.0000 mg | ORAL_TABLET | Freq: Every day | ORAL | 1 refills | Status: DC

## 2021-05-20 MED ORDER — NITROGLYCERIN 0.4 MG SL SUBL: 0.4000 mg | SUBLINGUAL_TABLET | SUBLINGUAL | 2 refills | Status: DC | PRN

## 2021-05-20 MED ORDER — CLOPIDOGREL BISULFATE 75 MG PO TABS: 75.0000 mg | ORAL_TABLET | Freq: Every day | ORAL | 2 refills | Status: DC

## 2021-05-20 NOTE — Research (Signed)
ID- [id number],  [facility]  Authorized Site Personnel  [x]   Shirley Muscat, RN     []   Jasmine Pang, RN  []   Philemon Kingdom, RN  []   Amy Enid Derry, RCIS  []   Berneda Rose, RN   SUBJECT NAME: Jeremy Jenkins MRN: _[id number]___________   Date of study introduction: _15-FEB-2023   ([id number])    Time of introduction: __11:15 _____  (24 hour clock)  During the subject's hospital visit , the authorized site personnel discussed with the subject the possibility to participate in the SOS-AMI study, and provided  the subject with the approved Subject Information Leaflet (SIL)-Informed Consent form (ICF) in a language that he can understand.  The subject took the document home to reflect on his participation in this study before signing it.   [x]   The Subject will return to the Cardiovascular Research office, at a later day, to            complete the consenting process.  OR  []   The Subject will be given ample time to reflect on this study but will be completing            consent process prior to his/ her discharge from this admission.  **Form based on IDORSIA [id number] [id number] [id number]

## 2021-05-20 NOTE — Discharge Summary (Signed)
Discharge Summary    Patient ID: Jeremy Jenkins MRN: [id number]; DOB: 03/06/1951  Admit date: 05/18/2021 Discharge date: 05/20/2021  PCP:  Lindell Spar, MD   [facility] Providers Cardiologist:  Carlyle Dolly, MD     Discharge Diagnoses    Principal Problem:   NSTEMI (non-ST elevated myocardial infarction) [facility]) Active Problems:   Prediabetes   S/P insertion of non-drug eluting coronary artery stent   Infrarenal abdominal aortic aneurysm (AAA) without rupture   Abdominal aortic aneurysm (AAA) without rupture   Diagnostic Studies/Procedures    Cath: 05/19/21  CONCLUSIONS: Diffuse in-stent restenosis proximal to mid RCA with worse region up to 95%. Angioplasty/scoring balloon followed by restenting starting beyond the margin of the previously placed stent and extending back to the proximal segment reducing to 0% stenosis with TIMI grade III flow. Widely patent left main LAD beyond the newly placed stent contains eccentric 60% stenosis and tandem 30 to 40% stenoses in the distal segment. 50 to 60% mid circumflex. LV function is normal.  LVEDP 11 mmHg.   RECOMMENDATIONS:   Dual antiplatelet therapy for 1 year then drop aspirin and continue clopidogrel indefinitely. Aggressive risk factor modification with LDL lowering to less than 55. Eligible for discharge in a.m. unless recurrent symptoms.  Diagnostic Dominance: Right Intervention    Echo: 05/19/21  IMPRESSIONS     1. Left ventricular ejection fraction, by estimation, is 55 to 60%. The  left ventricle has normal function. The left ventricle has no regional  wall motion abnormalities. Left ventricular diastolic parameters were  normal.   2. Right ventricular systolic function is normal. The right ventricular  size is normal. There is normal pulmonary artery systolic pressure. The  estimated right ventricular systolic pressure is 10.9 mmHg.   3. The mitral valve is grossly normal. No evidence of  mitral valve  regurgitation. No evidence of mitral stenosis.   4. The aortic valve is tricuspid. Aortic valve regurgitation is trivial.  No aortic stenosis is present.   5. The inferior vena cava is normal in size with <50% respiratory  variability, suggesting right atrial pressure of 8 mmHg.   FINDINGS   Left Ventricle: Left ventricular ejection fraction, by estimation, is 55  to 60%. The left ventricle has normal function. The left ventricle has no  regional wall motion abnormalities. The left ventricular internal cavity  size was normal in size. There is   no left ventricular hypertrophy. Left ventricular diastolic parameters  were normal.   Right Ventricle: The right ventricular size is normal. No increase in  right ventricular wall thickness. Right ventricular systolic function is  normal. There is normal pulmonary artery systolic pressure. The tricuspid  regurgitant velocity is 2.46 m/s, and   with an assumed right atrial pressure of 8 mmHg, the estimated right  ventricular systolic pressure is 32.3 mmHg.   Left Atrium: Left atrial size was normal in size.   Right Atrium: Right atrial size was normal in size.   Pericardium: There is no evidence of pericardial effusion.   Mitral Valve: The mitral valve is grossly normal. No evidence of mitral  valve regurgitation. No evidence of mitral valve stenosis.   Tricuspid Valve: The tricuspid valve is grossly normal. Tricuspid valve  regurgitation is trivial. No evidence of tricuspid stenosis.   Aortic Valve: The aortic valve is tricuspid. Aortic valve regurgitation is  trivial. No aortic stenosis is present. Aortic valve peak gradient  measures 6.9 mmHg.   Pulmonic Valve: The pulmonic valve was grossly  normal. Pulmonic valve  regurgitation is not visualized. No evidence of pulmonic stenosis.   Aorta: The aortic root and ascending aorta are structurally normal, with  no evidence of dilitation.   Venous: The inferior vena cava  is normal in size with less than 50%  respiratory variability, suggesting right atrial pressure of 8 mmHg.   IAS/Shunts: The atrial septum is grossly normal.  _____________   History of Present Illness     Jeremy Jenkins is a 71 y.o. male with STEMI s/p PCI to RCA (01/2011), HTN, HLD, AAA, prior tobacco abuse, prior EtOH abuse, and GERD who was seen 05/19/2021 for the evaluation of chest pain.  Hospital Course     NSTEMI with CAD s/p RCA stenting '12: hsTn 42>>55, presented with left sided  arm pain similar to what he experienced with previous stenting. Underwent cardiac cath noted above with diffuse ISR of p/mRCA treated with scoring and DES x1. Moderate nonobstructive disease in m/dRCA beyond stent to be treated medically. Recommendations for DAPT with ASA/Plavix for at least one year. Seen by CR. Follow up echo showed LVEF of 55-60% with no rWMA. -- continue ASA, plavix, statin, BB   HTN: stable -- continue metoprolol 50mg  daily    HLD: LDL 73 -- increased atorvastatin to 80mg   -- FLP/LFTs in 8 weeks    AAA: Recent ultrasound showed diameter increased to 4.8 cm.  Currently asymptomatic from an AAA standpoint. Seen by Dr. Donnetta Hutching as an outpatient. Needs good blood pressure control and close monitoring with repeat ultrasound as an outpatient.   ETOH use: no signs of withdrawal   General: Well developed, well nourished, male appearing in no acute distress. Head: Normocephalic, atraumatic.  Neck: Supple without bruits, JVD. Lungs:  Resp regular and unlabored, CTA. Heart: RRR, S1, S2, no S3, S4, or murmur; no rub. Abdomen: Soft, non-tender, non-distended with normoactive bowel sounds. No hepatomegaly. No rebound/guarding. No obvious abdominal masses. Extremities: No clubbing, cyanosis, edema. Distal pedal pulses are 2+ bilaterally. Right radial cath site stable without bruising or hematoma Neuro: Alert and oriented X 3. Moves all extremities spontaneously. Psych: Normal  affect.  Patient was seen by Dr. Claiborne Billings and deemed stable for discharge home. Follow up arranged in the office. Medications sent to his pharmacy. Educated by PharmD prior to discharge.   Did the patient have an acute coronary syndrome (MI, NSTEMI, STEMI, etc) this admission?:  No                               Did the patient have a percutaneous coronary intervention (stent / angioplasty)?:  Yes.     Cath/PCI Registry Performance & Quality Measures: Aspirin prescribed? - Yes ADP Receptor Inhibitor (Plavix/Clopidogrel, Brilinta/Ticagrelor or Effient/Prasugrel) prescribed (includes medically managed patients)? - Yes High Intensity Statin (Lipitor 40-80mg  or Crestor 20-40mg ) prescribed? - Yes For EF <40%, was ACEI/ARB prescribed? - Not Applicable (EF >/= 16%) For EF <40%, Aldosterone Antagonist (Spironolactone or Eplerenone) prescribed? - Not Applicable (EF >/= 10%) Cardiac Rehab Phase II ordered? - Yes       The patient will be scheduled for a TOC follow up appointment in 10-14 days.  A message has been sent to the [facility] and Scheduling Pool at the office where the patient should be seen for follow up.  _____________  Discharge Vitals Blood pressure 107/68, pulse 65, temperature 98.2 F (36.8 C), temperature source Oral, resp. rate 17, height 5\' 10"  (1.778 m),  weight 88.3 kg, SpO2 99 %.  Filed Weights   05/18/21 2058 05/19/21 0649  Weight: 88.5 kg 88.3 kg    Labs & Radiologic Studies    CBC Recent Labs    05/19/21 0408 05/20/21 0316  WBC 11.6* 10.6*  NEUTROABS 7.5  --   HGB 13.9 12.7*  HCT 43.5 39.5  MCV 93.8 94.3  PLT 187 989   Basic Metabolic Panel Recent Labs    05/19/21 0408 05/20/21 0316  NA 138 138  K 4.1 4.1  CL 104 106  CO2 25 25  GLUCOSE 112* 102*  BUN 15 14  CREATININE 0.93 0.93  CALCIUM 9.0 8.5*  MG 2.1  --    Liver Function Tests Recent Labs    05/18/21 2135  AST 17  ALT 9  ALKPHOS 89  BILITOT 0.9  PROT 7.2  ALBUMIN 4.1   No results for  input(s): LIPASE, AMYLASE in the last 72 hours. High Sensitivity Troponin:   Recent Labs  Lab 05/18/21 2137 05/18/21 2334  TROPONINIHS 42* 55*    BNP Invalid input(s): POCBNP D-Dimer No results for input(s): DDIMER in the last 72 hours. Hemoglobin A1C Recent Labs    05/19/21 0408  HGBA1C 5.7*   Fasting Lipid Panel Recent Labs    05/19/21 0408  CHOL 146  HDL 45  LDLCALC 73  TRIG 140  CHOLHDL 3.2   Thyroid Function Tests Recent Labs    05/19/21 0408  TSH 3.177   _____________  CARDIAC CATHETERIZATION  Result Date: 05/19/2021 CONCLUSIONS: Diffuse in-stent restenosis proximal to mid RCA with worse region up to 95%. Angioplasty/scoring balloon followed by restenting starting beyond the margin of the previously placed stent and extending back to the proximal segment reducing to 0% stenosis with TIMI grade III flow. Widely patent left main LAD beyond the newly placed stent contains eccentric 60% stenosis and tandem 30 to 40% stenoses in the distal segment. 50 to 60% mid circumflex. LV function is normal.  LVEDP 11 mmHg. RECOMMENDATIONS: Dual antiplatelet therapy for 1 year then drop aspirin and continue clopidogrel indefinitely. Aggressive risk factor modification with LDL lowering to less than 55. Eligible for discharge in a.m. unless recurrent symptoms.   DG Chest Port 1 View  Result Date: 05/18/2021 CLINICAL DATA:  Chest pain EXAM: PORTABLE CHEST 1 VIEW COMPARISON:  09/10/2017 FINDINGS: Heart and mediastinal contours are within normal limits. No focal opacities or effusions. No acute bony abnormality. IMPRESSION: No active disease. Electronically Signed   By: Rolm Baptise M.D.   On: 05/18/2021 22:30   DG Hand Complete Left  Result Date: 04/23/2021 CLINICAL DATA:  Left hand pain, history of remote trauma several months ago, initial encounter initial encounter EXAM: LEFT HAND - COMPLETE 3+ VIEW COMPARISON:  None. FINDINGS: Degenerative changes of the first [facility] joint are noted.  No acute fracture or dislocation is noted. No soft tissue abnormality is noted. IMPRESSION: Degenerative change without acute abnormality. Electronically Signed   By: Inez Catalina M.D.   On: 04/23/2021 00:01   VAS [place] AAA DUPLEX  Result Date: 05/08/2021 ABDOMINAL AORTA STUDY Patient Name:  Jeremy Jenkins  Date of Exam:   05/06/2021 Medical Rec #: [id number]           Accession #:    [id number] Date of Birth: 09-Mar-1951            Patient Gender: M Patient Age:   71 years Exam Location:  [facility] Procedure:  VAS [place] AAA DUPLEX Referring Phys: TODD EARLY --------------------------------------------------------------------------------  Indications: Follow up exam for known AAA.  Comparison Study: CTA at [facility] 08/21/17: 3.8 cm Performing Technologist: Ralene Cork RVT  Examination Guidelines: A complete evaluation includes B-mode imaging, spectral Doppler, color Doppler, and power Doppler as needed of all accessible portions of each vessel. Bilateral testing is considered an integral part of a complete examination. Limited examinations for reoccurring indications may be performed as noted.  Abdominal Aorta Findings: +-----------+-------+----------+----------+--------+--------+--------+  Location    AP (cm) Trans (cm) PSV (cm/s) Waveform Thrombus Comments  +-----------+-------+----------+----------+--------+--------+--------+  Proximal    2.99    3.16       44                                     +-----------+-------+----------+----------+--------+--------+--------+  Mid         4.44    4.84       32                                     +-----------+-------+----------+----------+--------+--------+--------+  Distal      2.38    2.64       46                                     +-----------+-------+----------+----------+--------+--------+--------+  RT CIA Prox 1.1     1.2        96                                     +-----------+-------+----------+----------+--------+--------+--------+  LT CIA  Prox 1.2     1.3        104                                    +-----------+-------+----------+----------+--------+--------+--------+  Summary: Abdominal Aorta: There is evidence of abnormal dilatation of the mid Abdominal aorta. The largest aortic diameter has increased compared to prior exam. Previous diameter measurement was 3.8 cm obtained on 08/21/17 by CTA.  *See table(s) above for measurements and observations.  Electronically signed by Orlie Pollen on 05/08/2021 at 5:32:02 PM.    Final    ECHOCARDIOGRAM COMPLETE  Result Date: 05/19/2021    ECHOCARDIOGRAM REPORT   Patient Name:   Jeremy Jenkins Date of Exam: 05/19/2021 Medical Rec #:  [id number]          Height:       70.0 in Accession #:    [id number]         Weight:       194.7 lb Date of Birth:  February 04, 1951           BSA:          2.063 m Patient Age:    43 years           BP:           133/83 mmHg Patient Gender: M                  HR:           58 bpm. Exam  Location:  Inpatient Procedure: 2D Echo Indications:    Nstemi  History:        Patient has no prior history of Echocardiogram examinations.                 CAD; Risk Factors:Hypertension.  Sonographer:    Jefferey Pica Referring Phys: [id number] [place]  1. Left ventricular ejection fraction, by estimation, is 55 to 60%. The left ventricle has normal function. The left ventricle has no regional wall motion abnormalities. Left ventricular diastolic parameters were normal.  2. Right ventricular systolic function is normal. The right ventricular size is normal. There is normal pulmonary artery systolic pressure. The estimated right ventricular systolic pressure is 31.5 mmHg.  3. The mitral valve is grossly normal. No evidence of mitral valve regurgitation. No evidence of mitral stenosis.  4. The aortic valve is tricuspid. Aortic valve regurgitation is trivial. No aortic stenosis is present.  5. The inferior vena cava is normal in size with <50% respiratory variability, suggesting  right atrial pressure of 8 mmHg. FINDINGS  Left Ventricle: Left ventricular ejection fraction, by estimation, is 55 to 60%. The left ventricle has normal function. The left ventricle has no regional wall motion abnormalities. The left ventricular internal cavity size was normal in size. There is  no left ventricular hypertrophy. Left ventricular diastolic parameters were normal. Right Ventricle: The right ventricular size is normal. No increase in right ventricular wall thickness. Right ventricular systolic function is normal. There is normal pulmonary artery systolic pressure. The tricuspid regurgitant velocity is 2.46 m/s, and  with an assumed right atrial pressure of 8 mmHg, the estimated right ventricular systolic pressure is 40.0 mmHg. Left Atrium: Left atrial size was normal in size. Right Atrium: Right atrial size was normal in size. Pericardium: There is no evidence of pericardial effusion. Mitral Valve: The mitral valve is grossly normal. No evidence of mitral valve regurgitation. No evidence of mitral valve stenosis. Tricuspid Valve: The tricuspid valve is grossly normal. Tricuspid valve regurgitation is trivial. No evidence of tricuspid stenosis. Aortic Valve: The aortic valve is tricuspid. Aortic valve regurgitation is trivial. No aortic stenosis is present. Aortic valve peak gradient measures 6.9 mmHg. Pulmonic Valve: The pulmonic valve was grossly normal. Pulmonic valve regurgitation is not visualized. No evidence of pulmonic stenosis. Aorta: The aortic root and ascending aorta are structurally normal, with no evidence of dilitation. Venous: The inferior vena cava is normal in size with less than 50% respiratory variability, suggesting right atrial pressure of 8 mmHg. IAS/Shunts: The atrial septum is grossly normal.  LEFT VENTRICLE PLAX 2D LVIDd:         4.60 cm   Diastology LVIDs:         3.00 cm   LV e' medial:    6.75 cm/s LV PW:         1.10 cm   LV E/e' medial:  12.7 LV IVS:        1.00 cm   LV e'  lateral:   8.10 cm/s LVOT diam:     2.00 cm   LV E/e' lateral: 10.6 LV SV:         81 LV SV Index:   39 LVOT Area:     3.14 cm  RIGHT VENTRICLE             IVC RV S prime:     11.30 cm/s  IVC diam: 2.30 cm TAPSE (M-mode): 2.2 cm LEFT ATRIUM  Index        RIGHT ATRIUM           Index LA diam:        3.50 cm 1.70 cm/m   RA Area:     13.50 cm LA Vol (A2C):   43.3 ml 20.98 ml/m  RA Volume:   31.70 ml  15.36 ml/m LA Vol (A4C):   29.6 ml 14.34 ml/m LA Biplane Vol: 36.4 ml 17.64 ml/m  AORTIC VALVE                 PULMONIC VALVE AV Area (Vmax): 2.68 cm     PV Vmax:       0.60 m/s AV Vmax:        131.50 cm/s  PV Peak grad:  1.4 mmHg AV Peak Grad:   6.9 mmHg LVOT Vmax:      112.00 cm/s LVOT Vmean:     70.000 cm/s LVOT VTI:       0.257 m  AORTA Ao Root diam: 4.20 cm Ao Asc diam:  3.90 cm MITRAL VALVE               TRICUSPID VALVE MV Area (PHT): 3.91 cm    TR Peak grad:   24.2 mmHg MV Decel Time: 194 msec    TR Vmax:        246.00 cm/s MV E velocity: 85.90 cm/s MV A velocity: 76.20 cm/s  SHUNTS MV E/A ratio:  1.13        Systemic VTI:  0.26 m                            Systemic Diam: 2.00 cm Eleonore Chiquito MD Electronically signed by Eleonore Chiquito MD Signature Date/Time: 05/19/2021/11:40:57 AM    Final    Disposition   Pt is being discharged home today in good condition.  Follow-up Plans & Appointments     Follow-up Information     Arnoldo Lenis, MD Follow up on 05/22/2021.   Specialty: Cardiology Why: at 3pm for your follow up appt Contact information: [place] [address] [phone]                Discharge Instructions     Amb Referral to Cardiac Rehabilitation   Complete by: As directed    Diagnosis:  Coronary Stents NSTEMI PTCA     After initial evaluation and assessments completed: Virtual Based Care may be provided alone or in conjunction with Phase 2 Cardiac Rehab based on patient barriers.: Yes       Discharge Medications    Allergies as of 05/20/2021       Reactions   Albuterol Anaphylaxis   Azithromycin Tinitus   Hearing loss   Penicillins Shortness Of Breath   Tadalafil Shortness Of Breath   Aspartame And Phenylalanine Other (See Comments)   HEADACHES   Topiramate Other (See Comments)   Memory loss   Buspirone Other (See Comments)   Memory loss   Hydrocodone Other (See Comments)   Tingling all over        Medication List     STOP taking these medications    omeprazole 40 MG capsule Commonly known as: PRILOSEC Replaced by: pantoprazole 40 MG tablet   sildenafil 20 MG tablet Commonly known as: REVATIO       TAKE these medications    aspirin 81 MG EC tablet Take 1 tablet (81 mg  total) by mouth daily. Swallow whole. Start taking on: May 21, 2021   atorvastatin 80 MG tablet Commonly known as: LIPITOR Take 1 tablet (80 mg total) by mouth daily. Start taking on: May 21, 2021 What changed:  medication strength how much to take   clopidogrel 75 MG tablet Commonly known as: PLAVIX Take 1 tablet (75 mg total) by mouth daily with breakfast. Start taking on: May 21, 2021   ergocalciferol 1.25 MG (50000 UT) capsule Commonly known as: VITAMIN D2 Take by mouth.   fluticasone 50 MCG/ACT nasal spray Commonly known as: FLONASE Place 1 spray into both nostrils daily.   magnesium 30 MG tablet Take 30 mg by mouth daily.   metoprolol succinate 50 MG 24 hr tablet Commonly known as: TOPROL-XL Take 50 mg by mouth daily. Take with or immediately following a meal. What changed: Another medication with the same name was removed. Continue taking this medication, and follow the directions you see here.   nitroGLYCERIN 0.4 MG SL tablet Commonly known as: NITROSTAT Place 1 tablet (0.4 mg total) under the tongue every 5 (five) minutes as needed for chest pain.   pantoprazole 40 MG tablet Commonly known as: PROTONIX Take 1 tablet (40 mg total) by mouth daily. Start taking  on: May 21, 2021 Replaces: omeprazole 40 MG capsule   traZODone 50 MG tablet Commonly known as: DESYREL Take 1 tablet (50 mg total) by mouth at bedtime.          Outstanding Labs/Studies   FLP/LFTs in 8 weeks  Duration of Discharge Encounter   Greater than 30 minutes including physician time.  Signed, Reino Bellis, NP 05/20/2021, 10:10 AM    Patient seen and examined. Agree with assessment and plan.  No recurrent chest pain.  Status post invention to 95% in-stent restenosis to the RCA.  Medical therapy for concomitant CAD.  Okay for discharge today.  Patient has a follow-up appointment already to see Dr. Carlyle Dolly this Friday.   Troy Sine, MD, [facility] 05/20/2021 12:27 PM

## 2021-05-20 NOTE — Progress Notes (Signed)
CARDIAC REHAB PHASE I   PRE:  Rate/Rhythm: 71 SR    BP: sitting 126/82    SaO2:   MODE:  Ambulation: 470 ft   POST:  Rate/Rhythm: 100 ST    BP: sitting 124/78     SaO2:   Tolerated well, no c/o. Discussed MI, stent, Plavix, restrictions, diet, exercise, NTG, and CRPII. Pt receptive. Will refer to [place].  [phone]   [place], ACSM 05/20/2021 8:55 AM

## 2021-05-20 NOTE — Discharge Instructions (Signed)

## 2021-05-20 NOTE — Progress Notes (Signed)
Removed TR band from right radial. Site is a level 0. Applied gauze and Tegaderm to site. Educated pt to leave in place for 24 hrs. Pt verbalized understanding.

## 2021-05-22 ENCOUNTER — Telehealth: Payer: Self-pay

## 2021-05-22 ENCOUNTER — Other Ambulatory Visit: Payer: Self-pay

## 2021-05-22 ENCOUNTER — Ambulatory Visit: Payer: Medicare HMO | Admitting: Cardiology

## 2021-05-22 ENCOUNTER — Encounter: Payer: Self-pay | Admitting: Cardiology

## 2021-05-22 VITALS — BP 120/84 | HR 92 | Ht 70.0 in | Wt 189.2 lb

## 2021-05-22 DIAGNOSIS — I2510 Atherosclerotic heart disease of native coronary artery without angina pectoris: Secondary | ICD-10-CM

## 2021-05-22 DIAGNOSIS — I10 Essential (primary) hypertension: Secondary | ICD-10-CM

## 2021-05-22 DIAGNOSIS — E782 Mixed hyperlipidemia: Secondary | ICD-10-CM

## 2021-05-22 NOTE — Patient Instructions (Signed)

## 2021-05-22 NOTE — Telephone Encounter (Signed)
FYI to let dr Posey Pronto know he is having a MI on Monday. Had heart attack this past Monday, 02.13.2023.

## 2021-05-22 NOTE — Progress Notes (Signed)
Clinical Summary Mr. Yankee is a 71 y.o.male seen today for follow up of the following medical problems.    1.CAD - history of prior STEMI with PCI to RCA 01/2011 - admit 05/2021 with chest pain. Mild trop 55.  - 05/2021 cath: diffuse ISR RCA stent, repeated stenting of RCA. Interventional recommended DAPT x 1 year then stop ASA and continue plavix - 05/2021 echo: LVEF 55-60%, no WMAs  - isolated episode of arm pain after discharge. Better after NG x 1. No recurrence.  - compliant with meds   2. HTN - he is compliant with meds   3. Hyperlipidemia 05/2021 during admission LDL 73 - atorvastatin was increased to 80mg  during admission.    4. AAA - 4.8 cm by 05/2021 [place] - followed by vascular Dr Donnetta Hutching.    5. EtoH abuse - prior history mentioend in chart    Past Medical History:  Diagnosis Date   Old myocardial infarction 09/11/2013   Formatting of this note might be different from the original. Overview:  01/2011 Formatting of this note might be different from the original. 01/2011     Allergies  Allergen Reactions   Albuterol Anaphylaxis   Azithromycin Tinitus    Hearing loss    Penicillins Shortness Of Breath   Tadalafil Shortness Of Breath   Aspartame And Phenylalanine Other (See Comments)    HEADACHES   Topiramate Other (See Comments)    Memory loss    Buspirone Other (See Comments)    Memory loss    Hydrocodone Other (See Comments)    Tingling all over     Current Outpatient Medications  Medication Sig Dispense Refill   aspirin EC 81 MG EC tablet Take 1 tablet (81 mg total) by mouth daily. Swallow whole. 90 tablet 2   atorvastatin (LIPITOR) 80 MG tablet Take 1 tablet (80 mg total) by mouth daily. 90 tablet 1   clopidogrel (PLAVIX) 75 MG tablet Take 1 tablet (75 mg total) by mouth daily with breakfast. 90 tablet 2   ergocalciferol (VITAMIN D2) 1.25 MG (50000 UT) capsule Take by mouth.     fluticasone (FLONASE) 50 MCG/ACT nasal spray Place 1 spray  into both nostrils daily.     magnesium 30 MG tablet Take 30 mg by mouth daily.     metoprolol succinate (TOPROL-XL) 50 MG 24 hr tablet Take 50 mg by mouth daily. Take with or immediately following a meal.     nitroGLYCERIN (NITROSTAT) 0.4 MG SL tablet Place 1 tablet (0.4 mg total) under the tongue every 5 (five) minutes as needed for chest pain. 25 tablet 2   pantoprazole (PROTONIX) 40 MG tablet Take 1 tablet (40 mg total) by mouth daily. 90 tablet 0   traZODone (DESYREL) 50 MG tablet Take 1 tablet (50 mg total) by mouth at bedtime. 90 tablet 1   No current facility-administered medications for this visit.     Past Surgical History:  Procedure Laterality Date   CARDIAC CATHETERIZATION     CORONARY STENT INTERVENTION N/A 05/19/2021   Procedure: CORONARY STENT INTERVENTION;  Surgeon: Belva Crome, MD;  Location: [facility];  Service: Cardiovascular;  Laterality: N/A;   LEFT HEART CATH AND CORONARY ANGIOGRAPHY N/A 05/19/2021   Procedure: LEFT HEART CATH AND CORONARY ANGIOGRAPHY;  Surgeon: Belva Crome, MD;  Location: [facility];  Service: Cardiovascular;  Laterality: N/A;     Allergies  Allergen Reactions   Albuterol Anaphylaxis   Azithromycin Tinitus  Hearing loss    Penicillins Shortness Of Breath   Tadalafil Shortness Of Breath   Aspartame And Phenylalanine Other (See Comments)    HEADACHES   Topiramate Other (See Comments)    Memory loss    Buspirone Other (See Comments)    Memory loss    Hydrocodone Other (See Comments)    Tingling all over      No family history on file.   Social History Mr. Mash reports that he quit smoking about 2 years ago. His smoking use included cigarettes. He has never used smokeless tobacco. Mr. Hiller reports that he does not currently use alcohol.   Review of Systems CONSTITUTIONAL: No weight loss, fever, chills, weakness or fatigue.  HEENT: Eyes: No visual loss, blurred vision, double vision or yellow  sclerae.No hearing loss, sneezing, congestion, runny nose or sore throat.  SKIN: No rash or itching.  CARDIOVASCULAR: per hpi RESPIRATORY: No shortness of breath, cough or sputum.  GASTROINTESTINAL: No anorexia, nausea, vomiting or diarrhea. No abdominal pain or blood.  GENITOURINARY: No burning on urination, no polyuria NEUROLOGICAL: No headache, dizziness, syncope, paralysis, ataxia, numbness or tingling in the extremities. No change in bowel or bladder control.  MUSCULOSKELETAL: No muscle, back pain, joint pain or stiffness.  LYMPHATICS: No enlarged nodes. No history of splenectomy.  PSYCHIATRIC: No history of depression or anxiety.  ENDOCRINOLOGIC: No reports of sweating, cold or heat intolerance. No polyuria or polydipsia.  Marland Kitchen   Physical Examination Today's Vitals   05/22/21 1452  BP: 120/84  Pulse: 92  SpO2: 97%  Weight: 189 lb 3.2 oz (85.8 kg)  Height: 5\' 10"  (1.778 m)   Body mass index is 27.15 kg/m.  Gen: resting comfortably, no acute distress HEENT: no scleral icterus, pupils equal round and reactive, no palptable cervical adenopathy,  CV: RRR, no m/rg, no jvd Resp: Clear to auscultation bilaterally GI: abdomen is soft, non-tender, non-distended, normal bowel sounds, no hepatosplenomegaly MSK: extremities are warm, no edema.  Skin: warm, no rash Neuro:  no focal deficits Psych: appropriate affect   Diagnostic Studies  Abd U/S 05/06/21:   Summary:  Abdominal Aorta: There is evidence of abnormal dilatation of the mid  Abdominal aorta. The largest aortic diameter has increased compared to  prior exam. Previous diameter measurement was 3.8 cm obtained on 08/21/17  by CTA.      TTE 08/22/17:   Interpretation Summary  A complete portable two-dimensional transthoracic echocardiogram with color  flow Doppler and Spectral Doppler was performed. The left ventricle is  normal in size.  There is mild concentric left ventricular hypertrophy.  The left ventricular  ejection fraction is normal (60-65%).  The left ventricular wall motion is normal.  Mild aortic sclerosis is present with good valvular opening.  There is mild aortic root dilatation.  The aortic valve is trileaflet.  Grade I mild diastolic dysfunction; abnormal relaxation pattern.  Estimation of right ventricular systolic pressure is not possible.      LHC 01/16/11:   CONCLUSIONS:  1.  Single vessel obstructive coronary artery disease in the setting of an  ST elevation myocardial infarction.  2.  Status post PCI to the proximal and mid RCA with a 3.0 x 28 and 3.5 x  12 mm vision bare-metal stents.  Lesion length was 30 mm, eccentric, mild  calcium, definite thrombus.  Lesion went from a 100% pre to 0% post  residual.  Pre-TIMI flow was 0, post-TIMI flow was 3.  3.  Right radial access.  4.  Preserved left ventricular function.    05/2021 cath Diffuse in-stent restenosis proximal to mid RCA with worse region up to 95%. Angioplasty/scoring balloon followed by restenting starting beyond the margin of the previously placed stent and extending back to the proximal segment reducing to 0% stenosis with TIMI grade III flow. Widely patent left main LAD beyond the newly placed stent contains eccentric 60% stenosis and tandem 30 to 40% stenoses in the distal segment. 50 to 60% mid circumflex. LV function is normal.  LVEDP 11 mmHg.   RECOMMENDATIONS:   Dual antiplatelet therapy for 1 year then drop aspirin and continue clopidogrel indefinitely. Aggressive risk factor modification with LDL lowering to less than 55. Eligible for discharge in a.m. unless recurrent symptoms.   05/2021 echo  IMPRESSIONS     1. Left ventricular ejection fraction, by estimation, is 55 to 60%. The  left ventricle has normal function. The left ventricle has no regional  wall motion abnormalities. Left ventricular diastolic parameters were  normal.   2. Right ventricular systolic function is normal. The right  ventricular  size is normal. There is normal pulmonary artery systolic pressure. The  estimated right ventricular systolic pressure is 81.2 mmHg.   3. The mitral valve is grossly normal. No evidence of mitral valve  regurgitation. No evidence of mitral stenosis.   4. The aortic valve is tricuspid. Aortic valve regurgitation is trivial.  No aortic stenosis is present.   5. The inferior vena cava is normal in size with <50% respiratory  variability, suggesting right atrial pressure of 8 mmHg.   Assessment and Plan  1.CAD - recent stenting as reported above - isolated mild episode of chest pain since discharge resolved with NG x 1. We discussed adding imdur however patient in favor of just monitoring at this time - continue DAPT x 1 year, then would stop ASA and continue plavix as recommended by intervention  2. Hyperlipidemia - LDL was above goal during admisison, atorva was increased to 80mg  daily  3. HTN - at goal, continue current meds - may consider ARB in the future given CAD, in general he would liek to limit medications.       Arnoldo Lenis, M.D.

## 2021-06-11 ENCOUNTER — Ambulatory Visit: Payer: Self-pay | Admitting: Internal Medicine

## 2021-06-19 DIAGNOSIS — M9903 Segmental and somatic dysfunction of lumbar region: Secondary | ICD-10-CM | POA: Diagnosis not present

## 2021-06-19 DIAGNOSIS — M9902 Segmental and somatic dysfunction of thoracic region: Secondary | ICD-10-CM | POA: Diagnosis not present

## 2021-06-19 DIAGNOSIS — M62830 Muscle spasm of back: Secondary | ICD-10-CM | POA: Diagnosis not present

## 2021-06-19 DIAGNOSIS — M546 Pain in thoracic spine: Secondary | ICD-10-CM | POA: Diagnosis not present

## 2021-06-19 DIAGNOSIS — M9905 Segmental and somatic dysfunction of pelvic region: Secondary | ICD-10-CM | POA: Diagnosis not present

## 2021-06-26 DIAGNOSIS — M9902 Segmental and somatic dysfunction of thoracic region: Secondary | ICD-10-CM | POA: Diagnosis not present

## 2021-06-26 DIAGNOSIS — M9905 Segmental and somatic dysfunction of pelvic region: Secondary | ICD-10-CM | POA: Diagnosis not present

## 2021-06-26 DIAGNOSIS — M546 Pain in thoracic spine: Secondary | ICD-10-CM | POA: Diagnosis not present

## 2021-06-26 DIAGNOSIS — M9903 Segmental and somatic dysfunction of lumbar region: Secondary | ICD-10-CM | POA: Diagnosis not present

## 2021-06-26 DIAGNOSIS — M62830 Muscle spasm of back: Secondary | ICD-10-CM | POA: Diagnosis not present

## 2021-07-06 DIAGNOSIS — M9903 Segmental and somatic dysfunction of lumbar region: Secondary | ICD-10-CM | POA: Diagnosis not present

## 2021-07-06 DIAGNOSIS — M546 Pain in thoracic spine: Secondary | ICD-10-CM | POA: Diagnosis not present

## 2021-07-06 DIAGNOSIS — M9905 Segmental and somatic dysfunction of pelvic region: Secondary | ICD-10-CM | POA: Diagnosis not present

## 2021-07-06 DIAGNOSIS — M62830 Muscle spasm of back: Secondary | ICD-10-CM | POA: Diagnosis not present

## 2021-07-06 DIAGNOSIS — M9902 Segmental and somatic dysfunction of thoracic region: Secondary | ICD-10-CM | POA: Diagnosis not present

## 2021-07-15 ENCOUNTER — Encounter: Payer: Self-pay | Admitting: Internal Medicine

## 2021-07-15 ENCOUNTER — Ambulatory Visit (INDEPENDENT_AMBULATORY_CARE_PROVIDER_SITE_OTHER): Payer: Medicare HMO | Admitting: Internal Medicine

## 2021-07-15 ENCOUNTER — Encounter: Payer: Self-pay | Admitting: *Deleted

## 2021-07-15 VITALS — BP 108/68 | HR 78 | Ht 70.0 in | Wt 193.6 lb

## 2021-07-15 DIAGNOSIS — K219 Gastro-esophageal reflux disease without esophagitis: Secondary | ICD-10-CM | POA: Diagnosis not present

## 2021-07-15 DIAGNOSIS — J309 Allergic rhinitis, unspecified: Secondary | ICD-10-CM | POA: Diagnosis not present

## 2021-07-15 DIAGNOSIS — M19042 Primary osteoarthritis, left hand: Secondary | ICD-10-CM

## 2021-07-15 DIAGNOSIS — I25110 Atherosclerotic heart disease of native coronary artery with unstable angina pectoris: Secondary | ICD-10-CM | POA: Diagnosis not present

## 2021-07-15 MED ORDER — AZELASTINE HCL 0.1 % NA SOLN: 2.0000 | Freq: Two times a day (BID) | NASAL | 12 refills | Status: DC

## 2021-07-15 MED ORDER — LEVOCETIRIZINE DIHYDROCHLORIDE 5 MG PO TABS: 5.0000 mg | ORAL_TABLET | Freq: Every evening | ORAL | 11 refills | Status: DC

## 2021-07-15 NOTE — Patient Instructions (Signed)
Please start taking Xyzal for allergies and use Azelastine nasal spray for sinusitis. ? ?Please contact your Cardiologist about anginal pain episodes/left arm pain. ? ?Please avoid taking oral Ibuprofen, Naproxen or other NSAIDs. Okay to take Tylenol as needed for hand pain. ?

## 2021-07-16 ENCOUNTER — Encounter: Payer: Self-pay | Admitting: Internal Medicine

## 2021-07-16 DIAGNOSIS — M19042 Primary osteoarthritis, left hand: Secondary | ICD-10-CM | POA: Insufficient documentation

## 2021-07-16 NOTE — Assessment & Plan Note (Signed)
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 Angioplasty/scoring balloon followed by restenting starting beyond the margin of the previously placed stent and extending back to the proximal segment reducing to 0% stenosis with TIMI grade III flow. ? ?On DAPT and statin currently ?Advised to take nitroglycerin as needed for chest pain or left arm pain ?Advised to contact cardiology to discuss about Imdur or Ranexa as he has persistent anginal pain ?

## 2021-07-16 NOTE — Assessment & Plan Note (Signed)
Advised to avoid oral NSAIDs due to his CAD ?Tylenol as needed ?Voltaren gel as needed ?

## 2021-07-16 NOTE — Assessment & Plan Note (Signed)
On pantoprazole now ?

## 2021-07-16 NOTE — Assessment & Plan Note (Signed)
Uses Flonase ?Added Xyzal and azelastine nasal spray ?Referred to ENT specialist ?

## 2021-07-16 NOTE — Progress Notes (Signed)
? ?Established Patient Office Visit ? ?Subjective:  ?Patient ID: Jeremy Jenkins, male    DOB: [birth year]  Age: 71 y.o. MRN: [id number] ? ?CC:  ?Chief Complaint  ?Patient presents with  ? Sinus Problem  ?  Has had sinus problems for years, has hearing concerns.   ? Arm Pain  ?  States he has angina some days and slight pain on left arm.  ? ? ?HPI ?Jeremy Jenkins is a 71 y.o. male with past medical history of CAD s/p stent placement, HLD and aortic aneurysm who presents for f/u of his chronic medical conditions. ? ?CAD: He with recently had cardiac cath as he had chest pain with left arm pain, which showed - Diffuse in-stent restenosis proximal to mid RCA with worse region up to 95%. Angioplasty/scoring balloon followed by restenting starting beyond the margin of the previously placed stent and extending back to the proximal segment reducing to 0% stenosis with TIMI grade III flow.  He has been taking aspirin, Plavix and statin.  He is still complains of slight pain on his left arm, but has tried taking nitroglycerin only once.  He denies any dyspnea or palpitations currently. ? ?He continues to have chronic nasal congestion and postnasal drip.  He has been using Flonase with minimal relief.  He has been able to see ENT specialist yet.  He denies any ear pain or discharge currently, but has hearing loss concern. ? ?He also complains of pain over wrist and small joints of the hand.  He has been taking oral ibuprofen with some relief. ? ? ? ? ?Past Medical History:  ?Diagnosis Date  ? NSTEMI (non-ST elevated myocardial infarction) ([place]) 05/19/2021  ? 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 Formatting of this note might be different from the original. Overview:  01/2011 Formatting of this note might be different from the original. 01/2011  ? ? ?Past Surgical History:  ?Procedure Laterality Date  ? CARDIAC CATHETERIZATION    ? CORONARY STENT INTERVENTION N/A 05/19/2021  ? Procedure: CORONARY STENT INTERVENTION;   Surgeon: Belva Crome, MD;  Location: [facility];  Service: Cardiovascular;  Laterality: N/A;  ? LEFT HEART CATH AND CORONARY ANGIOGRAPHY N/A 05/19/2021  ? Procedure: LEFT HEART CATH AND CORONARY ANGIOGRAPHY;  Surgeon: Belva Crome, MD;  Location: [facility];  Service: Cardiovascular;  Laterality: N/A;  ? ? ?Family History  ?Problem Relation Age of Onset  ? Sudden Cardiac Death Maternal Uncle   ? ? ?Social History  ? ?Socioeconomic History  ? Marital status: Single  ?  Spouse name: Not on file  ? Number of children: Not on file  ? Years of education: Not on file  ? Highest education level: Not on file  ?Occupational History  ? Not on file  ?Tobacco Use  ? Smoking status: Former  ?  Types: Cigarettes  ?  Quit date: 06/02/2018  ?  Years since quitting: 3.1  ? Smokeless tobacco: Never  ?Substance and Sexual Activity  ? Alcohol use: Not Currently  ? Drug use: Never  ? Sexual activity: Not on file  ?Other Topics Concern  ? Not on file  ?Social History Narrative  ? Not on file  ? ?Social Determinants of Health  ? ?Financial Resource Strain: Not on file  ?Food Insecurity: Not on file  ?Transportation Needs: Not on file  ?Physical Activity: Not on file  ?Stress: Not on file  ?Social Connections: Not on file  ?Intimate Partner Violence: Not  on file  ? ? ?Outpatient Medications Prior to Visit  ?Medication Sig Dispense Refill  ? aspirin EC 81 MG EC tablet Take 1 tablet (81 mg total) by mouth daily. Swallow whole. 90 tablet 2  ? atorvastatin (LIPITOR) 80 MG tablet Take 1 tablet (80 mg total) by mouth daily. 90 tablet 1  ? clopidogrel (PLAVIX) 75 MG tablet Take 1 tablet (75 mg total) by mouth daily with breakfast. 90 tablet 2  ? ergocalciferol (VITAMIN D2) 1.25 MG (50000 UT) capsule Take by mouth.    ? fluticasone (FLONASE) 50 MCG/ACT nasal spray Place 1 spray into both nostrils daily.    ? magnesium 30 MG tablet Take 30 mg by mouth daily.    ? metoprolol succinate (TOPROL-XL) 50 MG 24 hr tablet Take 50 mg by  mouth daily. Take with or immediately following a meal.    ? nitroGLYCERIN (NITROSTAT) 0.4 MG SL tablet Place 1 tablet (0.4 mg total) under the tongue every 5 (five) minutes as needed for chest pain. 25 tablet 2  ? pantoprazole (PROTONIX) 40 MG tablet Take 1 tablet (40 mg total) by mouth daily. 90 tablet 0  ? traZODone (DESYREL) 50 MG tablet Take 1 tablet (50 mg total) by mouth at bedtime. 90 tablet 1  ? ?No facility-administered medications prior to visit.  ? ? ?Allergies  ?Allergen Reactions  ? Albuterol Anaphylaxis  ? Azithromycin Tinitus  ?  Hearing loss ?  ? Penicillins Shortness Of Breath  ? Tadalafil Shortness Of Breath  ? Aspartame And Phenylalanine Other (See Comments)  ?  HEADACHES  ? Topiramate Other (See Comments)  ?  Memory loss ?  ? Buspirone Other (See Comments)  ?  Memory loss ?  ? Hydrocodone Other (See Comments)  ?  Tingling all over  ? ? ?ROS ?Review of Systems  ?Constitutional:  Negative for chills and fever.  ?HENT:  Positive for congestion, postnasal drip and rhinorrhea. Negative for sore throat.   ?Eyes:  Negative for pain and discharge.  ?Respiratory:  Negative for cough and shortness of breath.   ?Cardiovascular:  Negative for chest pain and palpitations.  ?Gastrointestinal:  Negative for diarrhea, nausea and vomiting.  ?Endocrine: Negative for polydipsia and polyuria.  ?Genitourinary:  Negative for dysuria and hematuria.  ?Musculoskeletal:  Negative for neck pain and neck stiffness.  ?Skin:  Negative for rash.  ?     Mole on back  ?Neurological:  Negative for dizziness, weakness, numbness and headaches.  ?Psychiatric/Behavioral:  Negative for agitation and behavioral problems.   ? ?  ?Objective:  ?  ?Physical Exam ?Vitals reviewed.  ?Constitutional:   ?   General: He is not in acute distress. ?   Appearance: He is not diaphoretic.  ?HENT:  ?   Head: Normocephalic and atraumatic.  ?   Nose: Congestion present.  ?   Mouth/Throat:  ?   Mouth: Mucous membranes are moist.  ?Eyes:  ?   General:  No scleral icterus. ?   Extraocular Movements: Extraocular movements intact.  ?Cardiovascular:  ?   Rate and Rhythm: Normal rate and regular rhythm.  ?   Pulses: Normal pulses.  ?   Heart sounds: Normal heart sounds. No murmur heard. ?Pulmonary:  ?   Breath sounds: Normal breath sounds. No wheezing or rales.  ?Abdominal:  ?   Palpations: Abdomen is soft.  ?   Tenderness: There is no abdominal tenderness.  ?Musculoskeletal:  ?   Cervical back: Neck supple. No tenderness.  ?  Right lower leg: No edema.  ?   Left lower leg: No edema.  ?Skin: ?   General: Skin is warm.  ?   Findings: No rash.  ?Neurological:  ?   General: No focal deficit present.  ?   Mental Status: He is alert and oriented to person, place, and time.  ?   Sensory: No sensory deficit.  ?   Motor: No weakness.  ?Psychiatric:     ?   Mood and Affect: Mood normal.     ?   Behavior: Behavior normal.  ? ? ?BP 108/68 (BP Location: Right Arm, Patient Position: Sitting)   Pulse 78   Ht '5\' 10"'$  (1.778 m)   Wt 193 lb 9.6 oz (87.8 kg)   SpO2 97%   BMI 27.78 kg/m?  ?Wt Readings from Last 3 Encounters:  ?07/15/21 193 lb 9.6 oz (87.8 kg)  ?05/22/21 189 lb 3.2 oz (85.8 kg)  ?05/19/21 194 lb 10.7 oz (88.3 kg)  ? ? ?Lab Results  ?Component Value Date  ? TSH 3.177 05/19/2021  ? ?Lab Results  ?Component Value Date  ? WBC 10.6 (H) 05/20/2021  ? HGB 12.7 (L) 05/20/2021  ? HCT 39.5 05/20/2021  ? MCV 94.3 05/20/2021  ? PLT 163 05/20/2021  ? ?Lab Results  ?Component Value Date  ? NA 138 05/20/2021  ? K 4.1 05/20/2021  ? CO2 25 05/20/2021  ? GLUCOSE 102 (H) 05/20/2021  ? BUN 14 05/20/2021  ? CREATININE 0.93 05/20/2021  ? BILITOT 0.9 05/18/2021  ? ALKPHOS 89 05/18/2021  ? AST 17 05/18/2021  ? ALT 9 05/18/2021  ? PROT 7.2 05/18/2021  ? ALBUMIN 4.1 05/18/2021  ? CALCIUM 8.5 (L) 05/20/2021  ? ANIONGAP 7 05/20/2021  ? ?Lab Results  ?Component Value Date  ? CHOL 146 05/19/2021  ? ?Lab Results  ?Component Value Date  ? HDL 45 05/19/2021  ? ?Lab Results  ?Component Value Date  ?  [place] 73 05/19/2021  ? ?Lab Results  ?Component Value Date  ? TRIG 140 05/19/2021  ? ?Lab Results  ?Component Value Date  ? CHOLHDL 3.2 05/19/2021  ? ?Lab Results  ?Component Value Date  ? HGBA1C 5.7 (H) 05/19/18

## 2021-08-03 DIAGNOSIS — H5200 Hypermetropia, unspecified eye: Secondary | ICD-10-CM | POA: Diagnosis not present

## 2021-08-11 ENCOUNTER — Ambulatory Visit (INDEPENDENT_AMBULATORY_CARE_PROVIDER_SITE_OTHER): Payer: Medicare HMO | Admitting: Internal Medicine

## 2021-08-11 ENCOUNTER — Encounter: Payer: Self-pay | Admitting: Internal Medicine

## 2021-08-11 VITALS — BP 108/76 | HR 90 | Resp 16 | Ht 70.0 in | Wt 194.6 lb

## 2021-08-11 DIAGNOSIS — G8929 Other chronic pain: Secondary | ICD-10-CM

## 2021-08-11 DIAGNOSIS — M546 Pain in thoracic spine: Secondary | ICD-10-CM | POA: Diagnosis not present

## 2021-08-11 MED ORDER — CYCLOBENZAPRINE HCL 5 MG PO TABS: 5.0000 mg | ORAL_TABLET | Freq: Two times a day (BID) | ORAL | 1 refills | Status: DC | PRN

## 2021-08-11 MED ORDER — PREDNISONE 10 MG (21) PO TBPK: ORAL_TABLET | ORAL | 0 refills | Status: DC

## 2021-08-11 NOTE — Patient Instructions (Addendum)
Please start taking Prednisone for back pain. Please take Flexeril for muscle spasms/stiffness. ? ?Please perform simple back stretching exercises. ? ?Please contact [facility] ENT specialist for chronic sinusitis. ?[address], [place], [place] [address] ?Phone: [phone] ?

## 2021-08-11 NOTE — Assessment & Plan Note (Signed)
Likely paraspinal muscle stiffness ?Sterapred taper ?Unable to take NSAIDs as he is on DAPT ?Flexeril PRN for muscle spasms ?Avoid heavy lifting and frequent bending ?Simple back exercises ?Check X-ray thoracic spine ?

## 2021-08-11 NOTE — Progress Notes (Signed)
? ?Acute Office Visit ? ?Subjective:  ? ? Patient ID: Jeremy Jenkins, male    DOB: 19-Jul-1950, 71 y.o.   MRN: [id number] ? ?Chief Complaint  ?Patient presents with  ? Back Pain  ?  Pt having back pain since 06-11-21 he twisted his back went to chiropractor with no relief   ? ? ?HPI ?Patient is in today for upper back pain for the last 2 months.  She denies any major injury or fall.  He recalls that he heard a pop while twisting his back when he woke up about 2 months ago.  His pain is constant, 5-8/10, dull, worse at nighttime when he sleeps on sides and better with Tylenol.  Denies any numbness, tingling or weakness of the UE or LE. He went to Chiropractor, but did not have any relief with spinal manipulation. ? ?Past Medical History:  ?Diagnosis Date  ? NSTEMI (non-ST elevated myocardial infarction) ([facility]) 05/19/2021  ? 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 Formatting of this note might be different from the original. Overview:  01/2011 Formatting of this note might be different from the original. 01/2011  ? ? ?Past Surgical History:  ?Procedure Laterality Date  ? CARDIAC CATHETERIZATION    ? CORONARY STENT INTERVENTION N/A 05/19/2021  ? Procedure: CORONARY STENT INTERVENTION;  Surgeon: Belva Crome, MD;  Location: [facility];  Service: Cardiovascular;  Laterality: N/A;  ? LEFT HEART CATH AND CORONARY ANGIOGRAPHY N/A 05/19/2021  ? Procedure: LEFT HEART CATH AND CORONARY ANGIOGRAPHY;  Surgeon: Belva Crome, MD;  Location: [facility];  Service: Cardiovascular;  Laterality: N/A;  ? ? ?Family History  ?Problem Relation Age of Onset  ? Sudden Cardiac Death Maternal Uncle   ? ? ?Social History  ? ?Socioeconomic History  ? Marital status: Single  ?  Spouse name: Not on file  ? Number of children: Not on file  ? Years of education: Not on file  ? Highest education level: Not on file  ?Occupational History  ? Not on file  ?Tobacco Use  ? Smoking status: Former  ?  Types: Cigarettes  ?  Quit date: 06/02/2018   ?  Years since quitting: 3.1  ? Smokeless tobacco: Never  ?Substance and Sexual Activity  ? Alcohol use: Not Currently  ? Drug use: Never  ? Sexual activity: Not on file  ?Other Topics Concern  ? Not on file  ?Social History Narrative  ? Not on file  ? ?Social Determinants of Health  ? ?Financial Resource Strain: Not on file  ?Food Insecurity: Not on file  ?Transportation Needs: Not on file  ?Physical Activity: Not on file  ?Stress: Not on file  ?Social Connections: Not on file  ?Intimate Partner Violence: Not on file  ? ? ?Outpatient Medications Prior to Visit  ?Medication Sig Dispense Refill  ? aspirin EC 81 MG EC tablet Take 1 tablet (81 mg total) by mouth daily. Swallow whole. 90 tablet 2  ? atorvastatin (LIPITOR) 80 MG tablet Take 1 tablet (80 mg total) by mouth daily. 90 tablet 1  ? azelastine (ASTELIN) 0.1 % nasal spray Place 2 sprays into both nostrils 2 (two) times daily. Use in each nostril as directed 30 mL 12  ? clopidogrel (PLAVIX) 75 MG tablet Take 1 tablet (75 mg total) by mouth daily with breakfast. 90 tablet 2  ? ergocalciferol (VITAMIN D2) 1.25 MG (50000 UT) capsule Take by mouth.    ? fluticasone (FLONASE) 50 MCG/ACT nasal spray Place  1 spray into both nostrils daily.    ? levocetirizine (XYZAL) 5 MG tablet Take 1 tablet (5 mg total) by mouth every evening. 30 tablet 11  ? magnesium 30 MG tablet Take 30 mg by mouth daily.    ? metoprolol succinate (TOPROL-XL) 50 MG 24 hr tablet Take 50 mg by mouth daily. Take with or immediately following a meal.    ? nitroGLYCERIN (NITROSTAT) 0.4 MG SL tablet Place 1 tablet (0.4 mg total) under the tongue every 5 (five) minutes as needed for chest pain. 25 tablet 2  ? pantoprazole (PROTONIX) 40 MG tablet Take 1 tablet (40 mg total) by mouth daily. 90 tablet 0  ? traZODone (DESYREL) 50 MG tablet Take 1 tablet (50 mg total) by mouth at bedtime. 90 tablet 1  ? ?No facility-administered medications prior to visit.  ? ? ?Allergies  ?Allergen Reactions  ? Albuterol  Anaphylaxis  ? Azithromycin Tinitus  ?  Hearing loss ?  ? Penicillins Shortness Of Breath  ? Tadalafil Shortness Of Breath  ? Aspartame And Phenylalanine Other (See Comments)  ?  HEADACHES  ? Topiramate Other (See Comments)  ?  Memory loss ?  ? Buspirone Other (See Comments)  ?  Memory loss ?  ? Hydrocodone Other (See Comments)  ?  Tingling all over  ? ? ?Review of Systems  ?Constitutional:  Negative for chills and fever.  ?HENT:  Positive for congestion, postnasal drip and rhinorrhea. Negative for sore throat.   ?Eyes:  Negative for pain and discharge.  ?Respiratory:  Negative for cough and shortness of breath.   ?Cardiovascular:  Negative for chest pain and palpitations.  ?Gastrointestinal:  Negative for diarrhea, nausea and vomiting.  ?Endocrine: Negative for polydipsia and polyuria.  ?Genitourinary:  Negative for dysuria and hematuria.  ?Musculoskeletal:  Positive for back pain. Negative for neck pain and neck stiffness.  ?Skin:  Negative for rash.  ?     Mole on back  ?Neurological:  Negative for dizziness, weakness, numbness and headaches.  ?Psychiatric/Behavioral:  Negative for agitation and behavioral problems.   ? ?   ?Objective:  ?  ?Physical Exam ?Vitals reviewed.  ?Constitutional:   ?   General: He is not in acute distress. ?   Appearance: He is not diaphoretic.  ?HENT:  ?   Head: Normocephalic and atraumatic.  ?   Nose: Congestion present.  ?   Mouth/Throat:  ?   Mouth: Mucous membranes are moist.  ?Eyes:  ?   General: No scleral icterus. ?   Extraocular Movements: Extraocular movements intact.  ?Cardiovascular:  ?   Rate and Rhythm: Normal rate and regular rhythm.  ?   Pulses: Normal pulses.  ?   Heart sounds: Normal heart sounds. No murmur heard. ?Pulmonary:  ?   Breath sounds: Normal breath sounds. No wheezing or rales.  ?Musculoskeletal:  ?   Cervical back: Neck supple. No tenderness.  ?   Right lower leg: No edema.  ?   Left lower leg: No edema.  ?Skin: ?   General: Skin is warm.  ?   Findings: No  rash.  ?Neurological:  ?   General: No focal deficit present.  ?   Mental Status: He is alert and oriented to person, place, and time.  ?   Sensory: No sensory deficit.  ?   Motor: No weakness.  ?Psychiatric:     ?   Mood and Affect: Mood normal.     ?   Behavior: Behavior normal.  ? ? ?  BP 108/76 (BP Location: Left Arm, Patient Position: Sitting, Cuff Size: Normal)   Pulse 90   Resp 16   Ht '5\' 10"'$  (1.778 m)   Wt 194 lb 9.6 oz (88.3 kg)   SpO2 96%   BMI 27.92 kg/m?  ?Wt Readings from Last 3 Encounters:  ?08/11/21 194 lb 9.6 oz (88.3 kg)  ?07/15/21 193 lb 9.6 oz (87.8 kg)  ?05/22/21 189 lb 3.2 oz (85.8 kg)  ? ? ? ?   ?Assessment & Plan:  ? ?Problem List Items Addressed This Visit   ? ?  ? Other  ? Chronic thoracic back pain - Primary  ?  Likely paraspinal muscle stiffness ?Sterapred taper ?Unable to take NSAIDs as he is on DAPT ?Flexeril PRN for muscle spasms ?Avoid heavy lifting and frequent bending ?Simple back exercises ?Check X-ray thoracic spine ? ?  ?  ? Relevant Medications  ? cyclobenzaprine (FLEXERIL) 5 MG tablet  ? predniSONE (STERAPRED UNI-PAK 21 TAB) 10 MG (21) TBPK tablet  ? Other Relevant Orders  ? DG Thoracic Spine 2 View  ? ? ? ?Meds ordered this encounter  ?Medications  ? cyclobenzaprine (FLEXERIL) 5 MG tablet  ?  Sig: Take 1 tablet (5 mg total) by mouth 2 (two) times daily as needed for muscle spasms.  ?  Dispense:  30 tablet  ?  Refill:  1  ? predniSONE (STERAPRED UNI-PAK 21 TAB) 10 MG (21) TBPK tablet  ?  Sig: Take as package instructions.  ?  Dispense:  1 each  ?  Refill:  0  ? ? ? ?Lindell Spar, MD ?

## 2021-08-16 ENCOUNTER — Other Ambulatory Visit: Payer: Self-pay | Admitting: Cardiology

## 2021-08-18 ENCOUNTER — Ambulatory Visit: Payer: Medicare HMO | Admitting: Internal Medicine

## 2021-09-06 ENCOUNTER — Other Ambulatory Visit: Payer: Self-pay | Admitting: Internal Medicine

## 2021-09-06 DIAGNOSIS — F5101 Primary insomnia: Secondary | ICD-10-CM

## 2021-09-15 ENCOUNTER — Encounter: Payer: Self-pay | Admitting: Family Medicine

## 2021-09-15 ENCOUNTER — Ambulatory Visit (INDEPENDENT_AMBULATORY_CARE_PROVIDER_SITE_OTHER): Payer: Medicare HMO | Admitting: Family Medicine

## 2021-09-15 VITALS — BP 128/84 | HR 114 | Ht 70.0 in | Wt 194.0 lb

## 2021-09-15 DIAGNOSIS — D539 Nutritional anemia, unspecified: Secondary | ICD-10-CM | POA: Diagnosis not present

## 2021-09-15 DIAGNOSIS — K625 Hemorrhage of anus and rectum: Secondary | ICD-10-CM | POA: Diagnosis not present

## 2021-09-15 DIAGNOSIS — K5790 Diverticulosis of intestine, part unspecified, without perforation or abscess without bleeding: Secondary | ICD-10-CM | POA: Diagnosis not present

## 2021-09-15 DIAGNOSIS — R69 Illness, unspecified: Secondary | ICD-10-CM | POA: Diagnosis not present

## 2021-09-15 DIAGNOSIS — F1020 Alcohol dependence, uncomplicated: Secondary | ICD-10-CM

## 2021-09-15 NOTE — Patient Instructions (Addendum)
F/U with dr Posey Pronto a before, call if you need to be seen sooner  You are referred to locl GI doc  I believe that your bleed is from diverticulosis, you were noted to have MODERATE dicerticulosis in your most recent colonoscopy  Please refrain from excessive alcohol use   Labs today CBC,, cmp and EGFR

## 2021-09-16 ENCOUNTER — Other Ambulatory Visit: Payer: Self-pay

## 2021-09-16 DIAGNOSIS — D539 Nutritional anemia, unspecified: Secondary | ICD-10-CM

## 2021-09-16 LAB — CBC
Hematocrit: 35.5 % — ABNORMAL LOW (ref 37.5–51.0)
Hemoglobin: 11.7 g/dL — ABNORMAL LOW (ref 13.0–17.7)
MCH: 29.6 pg (ref 26.6–33.0)
MCHC: 33 g/dL (ref 31.5–35.7)
MCV: 90 fL (ref 79–97)
Platelets: 209 10*3/uL (ref 150–450)
RBC: 3.95 x10E6/uL — ABNORMAL LOW (ref 4.14–5.80)
RDW: 13.3 % (ref 11.6–15.4)
WBC: 10.6 10*3/uL (ref 3.4–10.8)

## 2021-09-16 LAB — CMP14+EGFR
ALT: 7 IU/L (ref 0–44)
AST: 15 IU/L (ref 0–40)
Albumin/Globulin Ratio: 1.9 (ref 1.2–2.2)
Albumin: 4.2 g/dL (ref 3.7–4.7)
Alkaline Phosphatase: 99 IU/L (ref 44–121)
BUN/Creatinine Ratio: 12 (ref 10–24)
BUN: 13 mg/dL (ref 8–27)
Bilirubin Total: 0.6 mg/dL (ref 0.0–1.2)
CO2: 23 mmol/L (ref 20–29)
Calcium: 9.5 mg/dL (ref 8.6–10.2)
Chloride: 100 mmol/L (ref 96–106)
Creatinine, Ser: 1.13 mg/dL (ref 0.76–1.27)
Globulin, Total: 2.2 g/dL (ref 1.5–4.5)
Glucose: 124 mg/dL — ABNORMAL HIGH (ref 70–99)
Potassium: 4.4 mmol/L (ref 3.5–5.2)
Sodium: 139 mmol/L (ref 134–144)
Total Protein: 6.4 g/dL (ref 6.0–8.5)
eGFR: 69 mL/min/{1.73_m2} (ref >59)

## 2021-09-22 ENCOUNTER — Other Ambulatory Visit: Payer: Self-pay | Admitting: *Deleted

## 2021-09-22 DIAGNOSIS — K5790 Diverticulosis of intestine, part unspecified, without perforation or abscess without bleeding: Secondary | ICD-10-CM

## 2021-09-22 DIAGNOSIS — K625 Hemorrhage of anus and rectum: Secondary | ICD-10-CM

## 2021-09-28 ENCOUNTER — Encounter: Payer: Self-pay | Admitting: Family Medicine

## 2021-09-28 DIAGNOSIS — K625 Hemorrhage of anus and rectum: Secondary | ICD-10-CM | POA: Insufficient documentation

## 2021-09-28 NOTE — Progress Notes (Signed)
   Jeremy Jenkins     MRN: [id number]      DOB: 06/13/1950   HPI [name] is here with concern of BRRB over the past 2 days. First episode , unaware of having diverticulosis, which he does. Does report binge  drinking 3 days ago No associated pain with bleeding, unable to quantify, no light headedness Bleeding appears to have stopped today ROS Denies recent fever or chills. Denies sinus pressure, nasal congestion, ear pain or sore throat. Denies chest congestion, productive cough or wheezing. Denies chest pains, palpitations and leg swelling Denies abdominal pain, nausea, vomiting, Denies dysuria, frequency,  Denies joint pain, swelling and limitation in mobility. Denies headaches, seizures, numbness, or tingling. Denies  uncontrolled depression, anxiety or insomnia. Denies skin break down or rash.   PE  BP 128/84   Pulse (!) 114   Ht 5\' 10"  (1.778 m)   Wt 194 lb 0.6 oz (88 kg)   SpO2 95%   BMI 27.84 kg/m   Patient alert and oriented and in no cardiopulmonary distress.  HEENT: No facial asymmetry, EOMI,     Neck supple .  Chest: Clear to auscultation bilaterally.  CVS: S1, S2 no murmurs, no S3.Regular rate.  ABD: Soft non tender.   Ext: No edema  MS: Adequate ROM spine, shoulders, hips and knees.  Skin: Intact, no ulcerations or rash noted.  Psych: Good eye contact, normal affect. Memory intact not anxious or depressed appearing.  CNS: CN 2-12 intact, power,  normal throughout.no focal deficits noted.   Assessment & Plan  Diverticulosis Moderate diverticulosis noted  On most recent colonoscopy, refer to local GI to follow, pt unaware of the dx, first episode of frank rectal  Bleeding per his recall Educated about diverticulosis and need for re eval should bleeding become excessive  Rectal bleeding Maintained on plavix, recent excessive alcohol intake and BRRB in setting of diverticulosis. Pt education, check CBC, counseled to stop alcohol, ED if  persists or worsens  Alcohol use disorder, severe, dependence (HCC) Recent overindulgence precipitating GI bleed. Encouraged attendance at AA , needs to quit alcohol

## 2021-09-28 NOTE — Assessment & Plan Note (Signed)
Maintained on plavix, recent excessive alcohol intake and BRRB in setting of diverticulosis. Pt education, check CBC, counseled to stop alcohol, ED if persists or worsens

## 2021-09-28 NOTE — Assessment & Plan Note (Signed)
Moderate diverticulosis noted  On most recent colonoscopy, refer to local GI to follow, pt unaware of the dx, first episode of frank rectal  Bleeding per his recall Educated about diverticulosis and need for re eval should bleeding become excessive

## 2021-10-05 ENCOUNTER — Other Ambulatory Visit: Payer: Self-pay

## 2021-10-05 ENCOUNTER — Telehealth: Payer: Self-pay

## 2021-10-05 MED ORDER — METOPROLOL SUCCINATE ER 50 MG PO TB24: 50.0000 mg | ORAL_TABLET | Freq: Every day | ORAL | 2 refills | Status: DC

## 2021-10-05 NOTE — Telephone Encounter (Signed)
Erroneous entry

## 2021-10-09 LAB — FERRITIN: Ferritin: 20 ng/mL — ABNORMAL LOW (ref 30–400)

## 2021-10-09 LAB — SPECIMEN STATUS REPORT

## 2021-10-09 LAB — IRON: Iron: 55 ug/dL (ref 38–169)

## 2021-10-25 ENCOUNTER — Other Ambulatory Visit: Payer: Self-pay | Admitting: Internal Medicine

## 2021-10-28 ENCOUNTER — Telehealth: Payer: Self-pay | Admitting: Internal Medicine

## 2021-10-28 NOTE — Telephone Encounter (Signed)
Pt called stating that he is wanting to get into and see the behavioral health upstairs. He states he needs a referral. Can you please refer him?

## 2021-10-28 NOTE — Telephone Encounter (Signed)
Patient will need an appointment to discuss with provider can schedule virtually

## 2021-10-29 ENCOUNTER — Ambulatory Visit (INDEPENDENT_AMBULATORY_CARE_PROVIDER_SITE_OTHER): Payer: Medicare HMO | Admitting: Family Medicine

## 2021-10-29 ENCOUNTER — Encounter: Payer: Self-pay | Admitting: Internal Medicine

## 2021-10-29 DIAGNOSIS — F419 Anxiety disorder, unspecified: Secondary | ICD-10-CM | POA: Diagnosis not present

## 2021-10-29 DIAGNOSIS — R69 Illness, unspecified: Secondary | ICD-10-CM | POA: Diagnosis not present

## 2021-10-29 NOTE — Progress Notes (Signed)
Virtual Visit via Telephone Note   This visit type was conducted due to national recommendations for restrictions regarding the COVID-19 Pandemic (e.g. social distancing) in an effort to limit this patient's exposure and mitigate transmission in our community.  Due to his co-morbid illnesses, this patient is at least at moderate risk for complications without adequate follow up.  This format is felt to be most appropriate for this patient at this time.  The patient did not have access to video technology/had technical difficulties with video requiring transitioning to audio format only (telephone).  All issues noted in this document were discussed and addressed.  No physical exam could be performed with this format.  Please refer to the patient's chart for his  consent to telehealth for [facility].   Evaluation Performed:  Follow-up visit  Date:  10/29/2021   ID:  Jeremy Jenkins, DOB [birth year], MRN [id number]  Patient Location: Home Provider Location: Office/Clinic  Participants: Patient Location of Patient: Home Location of Provider: Telehealth Consent was obtain for visit to be over via telehealth. I verified that I am speaking with the correct person using two identifiers.  PCP:  Lindell Spar, MD   Chief Complaint:  Referral to a psychiatry  History of Present Illness:    Jeremy Jenkins is a 71 y.o. male with PMH of anxiety seen today with a request to speak with a therapist. He voices no significant life-changing events and denies symptoms of depression. He denies SI and HI.   The patient does not have symptoms concerning for COVID-19 infection (fever, chills, cough, or new shortness of breath).   Past Medical, Surgical, Social History, Allergies, and Medications have been Reviewed.  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 Overview:  01/2011 Formatting of this note might be different from the original. 01/2011   Past Surgical History:  Procedure Laterality Date   CARDIAC CATHETERIZATION     CORONARY STENT INTERVENTION N/A 05/19/2021   Procedure: CORONARY STENT INTERVENTION;  Surgeon: Belva Crome, MD;  Location: [facility];  Service: Cardiovascular;  Laterality: N/A;   LEFT HEART CATH AND CORONARY ANGIOGRAPHY N/A 05/19/2021   Procedure: LEFT HEART CATH AND CORONARY ANGIOGRAPHY;  Surgeon: Belva Crome, MD;  Location: [facility];  Service: Cardiovascular;  Laterality: N/A;     Current Meds  Medication Sig   aspirin EC 81 MG EC tablet Take 1 tablet (81 mg total) by mouth daily. Swallow whole.   atorvastatin (LIPITOR) 80 MG tablet Take 1 tablet (80 mg total) by mouth daily.   azelastine (ASTELIN) 0.1 % nasal spray Place 2 sprays into both nostrils 2 (two) times daily. Use in each nostril as directed   clopidogrel (PLAVIX) 75 MG tablet Take 1 tablet (75 mg total) by mouth daily with breakfast.   fluticasone (FLONASE) 50 MCG/ACT nasal spray Place 1 spray into both nostrils daily.   levocetirizine (XYZAL) 5 MG tablet Take 1 tablet (5 mg total) by mouth every evening.   magnesium 30 MG tablet Take 30 mg by mouth daily.   metoprolol succinate (TOPROL-XL) 50 MG 24 hr tablet TAKE 1 TABLET BY MOUTH ONCE DAILY WITH  OR  IMMEDIATELY  FOLLOWING  A  MEAL.   nitroGLYCERIN (NITROSTAT) 0.4 MG SL tablet Place 1 tablet (0.4 mg total) under the tongue  every 5 (five) minutes as needed for chest pain.   pantoprazole (PROTONIX) 40 MG tablet Take 1 tablet by mouth once daily   traZODone (DESYREL) 50 MG tablet TAKE 1 TABLET BY MOUTH AT BEDTIME     Allergies:   Albuterol, Azithromycin, Penicillins, Tadalafil, Aspartame and phenylalanine, Topiramate, Buspirone, and Hydrocodone   ROS:   Please see the history of present illness.     All other systems reviewed and are  negative.   Labs/Other Tests and Data Reviewed:    Recent Labs: 05/19/2021: Magnesium 2.1; TSH 3.177 09/15/2021: ALT 7; BUN 13; Creatinine, Ser 1.13; Hemoglobin 11.7; Platelets 209; Potassium 4.4; Sodium 139   Recent Lipid Panel Lab Results  Component Value Date/Time   CHOL 146 05/19/2021 04:08 AM   TRIG 140 05/19/2021 04:08 AM   HDL 45 05/19/2021 04:08 AM   CHOLHDL 3.2 05/19/2021 04:08 AM   LDLCALC 73 05/19/2021 04:08 AM    Wt Readings from Last 3 Encounters:  09/15/21 194 lb 0.6 oz (88 kg)  08/11/21 194 lb 9.6 oz (88.3 kg)  07/15/21 193 lb 9.6 oz (87.8 kg)     Objective:    Vital Signs:  There were no vitals taken for this visit.     ASSESSMENT & PLAN:   Anxiety Referral placed  Time:   Today, I have spent 8 minutes reviewing the chart, including problem list, medications, and with the patient with telehealth technology discussing the above problems.   Medication Adjustments/Labs and Tests Ordered: Current medicines are reviewed at length with the patient today.  Concerns regarding medicines are outlined above.   Tests Ordered: Orders Placed This Encounter  Procedures   Ambulatory referral to Psychiatry    Medication Changes: No orders of the defined types were placed in this encounter.          Disposition:  Follow up  Signed, Alvira Monday, FNP  10/29/2021 12:48 PM     [facility]

## 2021-11-12 ENCOUNTER — Other Ambulatory Visit: Payer: Self-pay | Admitting: Cardiology

## 2021-11-12 ENCOUNTER — Ambulatory Visit (INDEPENDENT_AMBULATORY_CARE_PROVIDER_SITE_OTHER): Payer: Medicare HMO | Admitting: Clinical

## 2021-11-12 ENCOUNTER — Encounter (HOSPITAL_COMMUNITY): Payer: Self-pay

## 2021-11-12 DIAGNOSIS — F1020 Alcohol dependence, uncomplicated: Secondary | ICD-10-CM | POA: Diagnosis not present

## 2021-11-12 DIAGNOSIS — R69 Illness, unspecified: Secondary | ICD-10-CM | POA: Diagnosis not present

## 2021-11-12 DIAGNOSIS — F4323 Adjustment disorder with mixed anxiety and depressed mood: Secondary | ICD-10-CM
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
## 2021-11-12 NOTE — Plan of Care (Signed)
Verbal Consent 

## 2021-11-18 ENCOUNTER — Ambulatory Visit (INDEPENDENT_AMBULATORY_CARE_PROVIDER_SITE_OTHER): Payer: Medicare HMO | Admitting: Internal Medicine

## 2021-11-18 ENCOUNTER — Encounter: Payer: Self-pay | Admitting: Internal Medicine

## 2021-11-18 VITALS — BP 118/78 | HR 78 | Resp 18 | Ht 70.0 in | Wt 198.6 lb

## 2021-11-18 DIAGNOSIS — K219 Gastro-esophageal reflux disease without esophagitis: Secondary | ICD-10-CM

## 2021-11-18 DIAGNOSIS — M5136 Other intervertebral disc degeneration, lumbar region: Secondary | ICD-10-CM | POA: Diagnosis not present

## 2021-11-18 DIAGNOSIS — I7143 Infrarenal abdominal aortic aneurysm, without rupture: Secondary | ICD-10-CM | POA: Diagnosis not present

## 2021-11-18 DIAGNOSIS — I10 Essential (primary) hypertension: Secondary | ICD-10-CM | POA: Diagnosis not present

## 2021-11-18 DIAGNOSIS — W57XXXA Bitten or stung by nonvenomous insect and other nonvenomous arthropods, initial encounter: Secondary | ICD-10-CM | POA: Diagnosis not present

## 2021-11-18 DIAGNOSIS — F5104 Psychophysiologic insomnia: Secondary | ICD-10-CM

## 2021-11-18 DIAGNOSIS — J309 Allergic rhinitis, unspecified: Secondary | ICD-10-CM

## 2021-11-18 DIAGNOSIS — S70361A Insect bite (nonvenomous), right thigh, initial encounter: Secondary | ICD-10-CM | POA: Diagnosis not present

## 2021-11-18 DIAGNOSIS — F419 Anxiety disorder, unspecified: Secondary | ICD-10-CM

## 2021-11-18 DIAGNOSIS — I25110 Atherosclerotic heart disease of native coronary artery with unstable angina pectoris: Secondary | ICD-10-CM | POA: Diagnosis not present

## 2021-11-18 DIAGNOSIS — R69 Illness, unspecified: Secondary | ICD-10-CM | POA: Diagnosis not present

## 2021-11-18 MED ORDER — PANTOPRAZOLE SODIUM 40 MG PO TBEC: 40.0000 mg | DELAYED_RELEASE_TABLET | Freq: Every day | ORAL | 1 refills | Status: DC

## 2021-11-18 MED ORDER — TRAMADOL HCL 50 MG PO TABS: 50.0000 mg | ORAL_TABLET | Freq: Two times a day (BID) | ORAL | 0 refills | Status: DC | PRN

## 2021-11-18 MED ORDER — ISOSORBIDE MONONITRATE ER 30 MG PO TB24: 30.0000 mg | ORAL_TABLET | Freq: Every day | ORAL | 5 refills | Status: DC

## 2021-11-18 NOTE — Assessment & Plan Note (Signed)
Well-controlled with Trazodone Followed by [facility] therapy

## 2021-11-18 NOTE — Patient Instructions (Signed)
Please take Imdur to help with chest pain episodes.  Please take Tylenol arthritis for mild-moderate back pain. Please take Tramadol for severe pain only.  Please continue taking other medications as prescribed.  Please continue to follow low salt diet and ambulate as tolerated.  Please consider getting Shingrix and Tdap vaccines at local pharmacy.

## 2021-11-18 NOTE — Assessment & Plan Note (Signed)
On pantoprazole now Has worse symptoms, but can not take Omeprazole as he is on Plavix

## 2021-11-18 NOTE — Assessment & Plan Note (Signed)
Chronic low back pain Advised to take Tylenol arthritis for mild-to-moderate pain Tramadol as needed for severe pain Avoid frequent bending and heavy lifting Back brace and/or heating pad as needed

## 2021-11-18 NOTE — Assessment & Plan Note (Signed)
Used to follow-up with vascular surgery, needs to schedule appointment

## 2021-11-18 NOTE — Assessment & Plan Note (Signed)
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 Angioplasty/scoring balloon followed by restenting starting beyond the margin of the previously placed stent and extending back to the proximal segment reducing to 0% stenosis with TIMI grade III flow.  On DAPT and statin currently Advised to take nitroglycerin as needed for chest pain or left arm pain Added Imdur for stable anginal pain

## 2021-11-18 NOTE — Progress Notes (Signed)
Established Patient Office Visit  Subjective:  Patient ID: Jeremy Jenkins, male    DOB: October 20, 1950  Age: 71 y.o. MRN: [id number]  CC:  Chief Complaint  Patient presents with   Follow-up    Follow up patient is sleeping better right leg has bug bite thinks its a chigger    HPI Jeremy Jenkins is a 71 y.o. male with past medical history of CAD s/p stent placement, HLD and aortic aneurysm who presents for f/u of her chronic medical conditions.  He c/o chronic low back pain. His pain is constant, 5-8/10, dull, worse at nighttime when he sleeps on sides and better with Tylenol.  Denies any numbness, tingling or weakness of the UE or LE.  CAD: He has been taking aspirin, Plavix and statin.  He is still complains of slight pain on his left arm, but has tried taking nitroglycerin only once.  He denies any dyspnea or palpitations currently. BP is wnl.  His nasal congestion and postnasal drip have improved with Xyzal.  Denies any fever or chills currently.  He takes trazodone as needed for insomnia.  He has talked to [facility] therapist for GAD.  Denies any SI or HI currently.  He had bug bite over his right thigh area around 08/11, and has been itching severely.  He has mild redness over the site.  Denies any active bleeding or discharge.    Past Medical History:  Diagnosis Date   NSTEMI (non-ST elevated myocardial infarction) ([place]) 05/19/2021   Jenkins myocardial infarction 09/11/2013   Formatting of this note might be different from the original. Overview:  01/2011 Formatting of this note might be different from the original. 01/2011    Past Surgical History:  Procedure Laterality Date   CARDIAC CATHETERIZATION     CORONARY STENT INTERVENTION N/A 05/19/2021   Procedure: CORONARY STENT INTERVENTION;  Surgeon: Belva Crome, MD;  Location: [place] [facility];  Service: Cardiovascular;  Laterality: N/A;   LEFT HEART CATH AND CORONARY ANGIOGRAPHY N/A 05/19/2021   Procedure: LEFT HEART CATH AND  CORONARY ANGIOGRAPHY;  Surgeon: Belva Crome, MD;  Location: [place] [facility];  Service: Cardiovascular;  Laterality: N/A;    Family History  Problem Relation Age of Onset   Sudden Cardiac Death Maternal Uncle     Social History   Socioeconomic History   Marital status: Single    Spouse name: Not on file   Number of children: Not on file   Years of education: Not on file   Highest education level: Not on file  Occupational History   Not on file  Tobacco Use   Smoking status: Former    Types: Cigarettes    Quit date: 06/02/2018    Years since quitting: 3.4   Smokeless tobacco: Never  Substance and Sexual Activity   Alcohol use: Not Currently   Drug use: Never   Sexual activity: Not on file  Other Topics Concern   Not on file  Social History Narrative   Not on file   Social Determinants of Health   Financial Resource Strain: Not on file  Food Insecurity: Not on file  Transportation Needs: Not on file  Physical Activity: Not on file  Stress: Not on file  Social Connections: Not on file  Intimate Partner Violence: Not on file    Outpatient Medications Prior to Visit  Medication Sig Dispense Refill   aspirin EC 81 MG EC tablet Take 1 tablet (81 mg total) by mouth daily.  Swallow whole. 90 tablet 2   atorvastatin (LIPITOR) 80 MG tablet Take 1 tablet (80 mg total) by mouth daily. 90 tablet 1   azelastine (ASTELIN) 0.1 % nasal spray Place 2 sprays into both nostrils 2 (two) times daily. Use in each nostril as directed 30 mL 12   clopidogrel (PLAVIX) 75 MG tablet Take 1 tablet (75 mg total) by mouth daily with breakfast. 90 tablet 2   fluticasone (FLONASE) 50 MCG/ACT nasal spray Place 1 spray into both nostrils daily.     levocetirizine (XYZAL) 5 MG tablet Take 1 tablet (5 mg total) by mouth every evening. 30 tablet 11   magnesium 30 MG tablet Take 30 mg by mouth daily.     metoprolol succinate (TOPROL-XL) 50 MG 24 hr tablet TAKE 1 TABLET BY MOUTH ONCE DAILY WITH  OR   IMMEDIATELY  FOLLOWING  A  MEAL. 90 tablet 0   nitroGLYCERIN (NITROSTAT) 0.4 MG SL tablet Place 1 tablet (0.4 mg total) under the tongue every 5 (five) minutes as needed for chest pain. 25 tablet 2   traZODone (DESYREL) 50 MG tablet TAKE 1 TABLET BY MOUTH AT BEDTIME 90 tablet 1   pantoprazole (PROTONIX) 40 MG tablet Take 1 tablet by mouth once daily 90 tablet 1   No facility-administered medications prior to visit.    Allergies  Allergen Reactions   Albuterol Anaphylaxis   Azithromycin Tinitus    Hearing loss    Penicillins Shortness Of Breath   Tadalafil Shortness Of Breath   Aspartame And Phenylalanine Other (See Comments)    HEADACHES   Topiramate Other (See Comments)    Memory loss    Buspirone Other (See Comments)    Memory loss    Hydrocodone Other (See Comments)    Tingling all over    ROS Review of Systems  Constitutional:  Negative for chills and fever.  HENT:  Positive for congestion, postnasal drip and rhinorrhea. Negative for sore throat.   Eyes:  Negative for pain and discharge.  Respiratory:  Negative for cough and shortness of breath.   Cardiovascular:  Negative for chest pain and palpitations.  Gastrointestinal:  Negative for diarrhea, nausea and vomiting.  Endocrine: Negative for polydipsia and polyuria.  Genitourinary:  Negative for dysuria and hematuria.  Musculoskeletal:  Positive for back pain. Negative for neck pain and neck stiffness.  Skin:  Positive for rash.       Mole on back  Neurological:  Negative for dizziness, weakness, numbness and headaches.  Psychiatric/Behavioral:  Positive for sleep disturbance. Negative for agitation and behavioral problems.       Objective:    Physical Exam Vitals reviewed.  Constitutional:      General: He is not in acute distress.    Appearance: He is not diaphoretic.  HENT:     Head: Normocephalic and atraumatic.     Nose: No congestion.     Mouth/Throat:     Mouth: Mucous membranes are moist.  Eyes:      General: No scleral icterus.    Extraocular Movements: Extraocular movements intact.  Cardiovascular:     Rate and Rhythm: Normal rate and regular rhythm.     Pulses: Normal pulses.     Heart sounds: Normal heart sounds. No murmur heard. Pulmonary:     Breath sounds: Normal breath sounds. No wheezing or rales.  Musculoskeletal:        General: Tenderness (Lumbar spine area) present.     Cervical back: Neck supple. No tenderness.  Right lower leg: No edema.     Left lower leg: No edema.  Skin:    General: Skin is warm.     Findings: Lesion (Mild erythema over right thigh, posterior aspect) present.  Neurological:     General: No focal deficit present.     Mental Status: He is alert and oriented to person, place, and time.     Sensory: No sensory deficit.     Motor: No weakness.  Psychiatric:        Mood and Affect: Mood normal.        Behavior: Behavior normal.     BP 118/78 (BP Location: Right Arm, Patient Position: Sitting, Cuff Size: Normal)   Pulse 78   Resp 18   Ht 5' 10"  (1.778 m)   Wt 198 lb 9.6 oz (90.1 kg)   SpO2 97%   BMI 28.50 kg/m  Wt Readings from Last 3 Encounters:  11/18/21 198 lb 9.6 oz (90.1 kg)  09/15/21 194 lb 0.6 oz (88 kg)  08/11/21 194 lb 9.6 oz (88.3 kg)    Lab Results  Component Value Date   TSH 3.177 05/19/2021   Lab Results  Component Value Date   WBC 10.6 09/15/2021   HGB 11.7 (L) 09/15/2021   HCT 35.5 (L) 09/15/2021   MCV 90 09/15/2021   PLT 209 09/15/2021   Lab Results  Component Value Date   NA 139 09/15/2021   K 4.4 09/15/2021   CO2 23 09/15/2021   GLUCOSE 124 (H) 09/15/2021   BUN 13 09/15/2021   CREATININE 1.13 09/15/2021   BILITOT 0.6 09/15/2021   ALKPHOS 99 09/15/2021   AST 15 09/15/2021   ALT 7 09/15/2021   PROT 6.4 09/15/2021   ALBUMIN 4.2 09/15/2021   CALCIUM 9.5 09/15/2021   ANIONGAP 7 05/20/2021   EGFR 69 09/15/2021   Lab Results  Component Value Date   CHOL 146 05/19/2021   Lab Results   Component Value Date   HDL 45 05/19/2021   Lab Results  Component Value Date   LDLCALC 73 05/19/2021   Lab Results  Component Value Date   TRIG 140 05/19/2021   Lab Results  Component Value Date   CHOLHDL 3.2 05/19/2021   Lab Results  Component Value Date   HGBA1C 5.7 (H) 05/19/2021      Assessment & Plan:   Problem List Items Addressed This Visit       Cardiovascular and Mediastinum   Benign essential hypertension    BP Readings from Last 1 Encounters:  11/18/21 118/78  Well-controlled, on metoprolol for CAD Added Imdur for angina Counseled for compliance with the medications Advised DASH diet and moderate exercise/walking, at least 150 mins/week       Relevant Medications   isosorbide mononitrate (IMDUR) 30 MG 24 hr tablet   Coronary artery disease    Recently had cardiac cath  - diffuse in-stent restenosis proximal to mid RCA with worse region up to 95%. Angioplasty/scoring balloon followed by restenting starting beyond the margin of the previously placed stent and extending back to the proximal segment reducing to 0% stenosis with TIMI grade III flow.  On DAPT and statin currently Advised to take nitroglycerin as needed for chest pain or left arm pain Added Imdur for stable anginal pain      Relevant Medications   isosorbide mononitrate (IMDUR) 30 MG 24 hr tablet   Infrarenal abdominal aortic aneurysm (AAA) without rupture (HCC)    Used to follow-up with vascular surgery,  needs to schedule appointment      Relevant Medications   isosorbide mononitrate (IMDUR) 30 MG 24 hr tablet     Respiratory   Allergic sinusitis    Uses Flonase Well-controlled with Xyzal and azelastine nasal spray Referred to ENT specialist        Digestive   Gastro-esophageal reflux disease without esophagitis    On pantoprazole now Has worse symptoms, but can not take Omeprazole as he is on Plavix      Relevant Medications   pantoprazole (PROTONIX) 40 MG tablet      Musculoskeletal and Integument   DDD (degenerative disc disease), lumbar    Chronic low back pain Advised to take Tylenol arthritis for mild-to-moderate pain Tramadol as needed for severe pain Avoid frequent bending and heavy lifting Back brace and/or heating pad as needed      Relevant Medications   traMADol (ULTRAM) 50 MG tablet     Other   Anxiety    Well-controlled with Trazodone Followed by [facility] therapy      Insomnia - Primary    Takes trazodone 50 mg nightly as needed      Other Visit Diagnoses     Insect bite of right thigh, initial encounter   Recent insect bite No fever, chills or fatigue Rash does not look like erythema migrans Advised to apply Neosporin as needed for itching       Meds ordered this encounter  Medications   isosorbide mononitrate (IMDUR) 30 MG 24 hr tablet    Sig: Take 1 tablet (30 mg total) by mouth daily.    Dispense:  30 tablet    Refill:  5   traMADol (ULTRAM) 50 MG tablet    Sig: Take 1 tablet (50 mg total) by mouth every 12 (twelve) hours as needed for moderate pain or severe pain.    Dispense:  20 tablet    Refill:  0   pantoprazole (PROTONIX) 40 MG tablet    Sig: Take 1 tablet (40 mg total) by mouth daily.    Dispense:  90 tablet    Refill:  1    Follow-up: Return in about 4 months (around 03/20/2022) for Annual physical.    Lindell Spar, MD

## 2021-11-18 NOTE — Assessment & Plan Note (Signed)
Takes trazodone 50 mg nightly as needed

## 2021-11-18 NOTE — Assessment & Plan Note (Signed)
Uses Flonase Well-controlled with Xyzal and azelastine nasal spray Referred to ENT specialist

## 2021-11-18 NOTE — Assessment & Plan Note (Signed)
BP Readings from Last 1 Encounters:  11/18/21 118/78   Well-controlled, on metoprolol for CAD Added Imdur for angina Counseled for compliance with the medications Advised DASH diet and moderate exercise/walking, at least 150 mins/week

## 2021-12-03 ENCOUNTER — Ambulatory Visit (INDEPENDENT_AMBULATORY_CARE_PROVIDER_SITE_OTHER): Payer: Medicare HMO | Admitting: Clinical

## 2021-12-03 DIAGNOSIS — R69 Illness, unspecified: Secondary | ICD-10-CM | POA: Diagnosis not present

## 2021-12-03 DIAGNOSIS — F4323 Adjustment disorder with mixed anxiety and depressed mood: Secondary | ICD-10-CM

## 2021-12-03 NOTE — Progress Notes (Signed)
IN PERSON  I connected with Jeremy Jenkins on 12/03/21 at  9:00 AM EDT in person and verified that I am speaking with the correct person using two identifiers.  Location: Patient: Office Provider: Office    I discussed the limitations of evaluation and management by telemedicine and the availability of in person appointments. The patient expressed understanding and agreed to proceed.    THERAPIST PROGRESS NOTE   Session Time: 9:00 AM-9:55 AM   Participation Level: Active   Behavioral Response: CasualAlertDepressed   Type of Therapy: Individual Therapy   Treatment Goals addressed: Coping Mood Management   Interventions: CBT, Motivational Interviewing, Solution Focused and Supportive   Summary: Jeremy Jenkins  is a 71 y.o. male who presents with Adjustment Disorder. The OPT therapist worked with the patient for his scheduled session. The OPT therapist utilized Motivational Interviewing to assist in creating therapeutic repore. The patient reviewed his ongoing work to adjust with stage of life changes including ailing health, since of finding purpose, and dealing with being alone during times his current partner/wife is not at home with him. The patient spoke about the pattern he previously feel in of coping by using alcohol. The OPT therapist worked with the patient in the session overviewing other coping strategies that are positive coping mechanisms.The OPT therapist utilized Cognitive Behavioral Therapy through cognitive restructuring as well as worked with the patient on coping strategies to assist in management of mood. The patient verbalized his willingness to trial and error reviewed coping and to give feedback at his next OPT session. The patient is looking to join the [employer] to allow him more opportunity for leisure and socialization.   Suicidal/Homicidal: Nowithout intent/plan   Therapist Response: The OPT therapist worked with the patient for the  patients scheduled session. The patient was engaged in his session and gave feedback in relation to triggers, symptoms, and behavior responses over the past few weeks. The OPT therapist worked with the patient utilizing an in session Cognitive Behavioral Therapy exercise. The patient was responsive in the session and verbalized, " I think finding my answer for what do I do when I am alone is going to be important because its going to happen more often if my wife goes to work in [employer] The OPT therapist encouraged the patient to continue to work to connect with resources for Seniors in his area. The patient will be checking into getting connected with the [facility].The OPT therapist will continue treatment work with the patient in his next scheduled session.     Plan: Return again in 2/3 weeks.   Diagnosis:      Axis I: Adjustment Disorder                           Axis II: No diagnosis     Collaboration of Care: No additional collaboration of care.    Patient/Guardian was advised Release of Information must be obtained prior to any record release in order to collaborate their care with an outside provider. Patient/Guardian was advised if they have not already done so to contact the registration department to sign all necessary forms in order for [place] to release information regarding their care.    Consent: Patient/Guardian gives verbal consent for treatment and assignment of benefits for services provided during this visit. Patient/Guardian expressed understanding and agreed to proceed.    I discussed the assessment and treatment plan  with the patient. The patient was provided an opportunity to ask questions and all were answered. The patient agreed with the plan and demonstrated an understanding of the instructions.   The patient was advised to call back or seek an in-person evaluation if the symptoms worsen or if the condition fails to improve as anticipated.   I  provided 55 minutes of face-to-face time during this encounter.   Lennox Grumbles, LCSW   12/03/2021

## 2021-12-08 DIAGNOSIS — H9313 Tinnitus, bilateral: Secondary | ICD-10-CM | POA: Diagnosis not present

## 2021-12-08 DIAGNOSIS — K219 Gastro-esophageal reflux disease without esophagitis: Secondary | ICD-10-CM | POA: Diagnosis not present

## 2021-12-08 DIAGNOSIS — R0982 Postnasal drip: Secondary | ICD-10-CM | POA: Diagnosis not present

## 2021-12-08 DIAGNOSIS — Z87891 Personal history of nicotine dependence: Secondary | ICD-10-CM | POA: Diagnosis not present

## 2021-12-10 ENCOUNTER — Other Ambulatory Visit: Payer: Self-pay | Admitting: Cardiology

## 2021-12-11 ENCOUNTER — Ambulatory Visit: Payer: Medicare HMO | Attending: Cardiology | Admitting: Cardiology

## 2021-12-11 ENCOUNTER — Encounter: Payer: Self-pay | Admitting: Cardiology

## 2021-12-11 ENCOUNTER — Other Ambulatory Visit (HOSPITAL_COMMUNITY)
Admission: RE | Admit: 2021-12-11 | Discharge: 2021-12-11 | Disposition: A | Discharge status: Home / Self Care | Payer: Medicare HMO | Source: Ambulatory Visit | Attending: Cardiology | Admitting: Cardiology

## 2021-12-11 VITALS — BP 122/84 | HR 101 | Ht 70.0 in | Wt 195.8 lb

## 2021-12-11 DIAGNOSIS — E782 Mixed hyperlipidemia: Secondary | ICD-10-CM | POA: Insufficient documentation

## 2021-12-11 DIAGNOSIS — I10 Essential (primary) hypertension: Secondary | ICD-10-CM | POA: Diagnosis not present

## 2021-12-11 DIAGNOSIS — I2510 Atherosclerotic heart disease of native coronary artery without angina pectoris: Secondary | ICD-10-CM

## 2021-12-11 DIAGNOSIS — K219 Gastro-esophageal reflux disease without esophagitis: Secondary | ICD-10-CM

## 2021-12-11 LAB — LIPID PANEL
Cholesterol: 128 mg/dL (ref 0–200)
HDL: 29 mg/dL — ABNORMAL LOW (ref >40)
LDL Cholesterol: 80 mg/dL (ref 0–99)
Total CHOL/HDL Ratio: 4.4 RATIO
Triglycerides: 95 mg/dL (ref <150)
VLDL: 19 mg/dL (ref 0–40)

## 2021-12-11 MED ORDER — PANTOPRAZOLE SODIUM 40 MG PO TBEC: 40.0000 mg | DELAYED_RELEASE_TABLET | Freq: Two times a day (BID) | ORAL | 3 refills | Status: DC

## 2021-12-11 NOTE — Progress Notes (Signed)
Clinical Summary Mr. Kaltenbach is a 71 y.o.male seen today for follow up of the following medical problems.      1.CAD - history of prior STEMI with PCI to RCA 01/2011 - admit 05/2021 with chest pain. Mild trop 55.  - 05/2021 cath: diffuse ISR RCA stent, repeated stenting of RCA. Interventional recommended DAPT x 1 year then stop ASA and continue plavix - 05/2021 echo: LVEF 55-60%, no WMAs    - infrequent chest pains, very mild in intensity similar to last visit - pcp recently started imdur   2. HTN - he is compliant with meds     3. Hyperlipidemia 05/2021 during admission LDL 73 - atorvastatin was increased to '80mg'$  during admission.  - compliant with statin   4. AAA - 4.8 cm by 05/2021 [place] - followed by vascular Dr Donnetta Hutching.      5. EtoH abuse - prior history mentioend in chart     SH: recent sold house, bought camper and travelled extensively. [place] for 18 months       Past Medical History:   Past Medical History:  Diagnosis Date   NSTEMI (non-ST elevated myocardial infarction) ([place]) 05/19/2021   Old myocardial infarction 09/11/2013   Formatting of this note might be different from the original. Overview:  01/2011 Formatting of this note might be different from the original. 01/2011     Allergies  Allergen Reactions   Albuterol Anaphylaxis   Azithromycin Tinitus    Hearing loss    Penicillins Shortness Of Breath   Tadalafil Shortness Of Breath   Aspartame And Phenylalanine Other (See Comments)    HEADACHES   Topiramate Other (See Comments)    Memory loss    Buspirone Other (See Comments)    Memory loss    Hydrocodone Other (See Comments)    Tingling all over     Current Outpatient Medications  Medication Sig Dispense Refill   aspirin EC 81 MG EC tablet Take 1 tablet (81 mg total) by mouth daily. Swallow whole. 90 tablet 2   atorvastatin (LIPITOR) 80 MG tablet Take 1 tablet (80 mg total) by mouth daily. 90 tablet 1   azelastine (ASTELIN) 0.1 %  nasal spray Place 2 sprays into both nostrils 2 (two) times daily. Use in each nostril as directed 30 mL 12   clopidogrel (PLAVIX) 75 MG tablet Take 1 tablet (75 mg total) by mouth daily with breakfast. 90 tablet 2   fluticasone (FLONASE) 50 MCG/ACT nasal spray Place 1 spray into both nostrils daily.     isosorbide mononitrate (IMDUR) 30 MG 24 hr tablet Take 1 tablet (30 mg total) by mouth daily. 30 tablet 5   levocetirizine (XYZAL) 5 MG tablet Take 1 tablet (5 mg total) by mouth every evening. 30 tablet 11   magnesium 30 MG tablet Take 30 mg by mouth daily.     metoprolol succinate (TOPROL-XL) 50 MG 24 hr tablet TAKE 1 TABLET BY MOUTH ONCE DAILY WITH  OR  IMMEDIATELY  FOLLOWING  A  MEAL. 90 tablet 0   nitroGLYCERIN (NITROSTAT) 0.4 MG SL tablet Place 1 tablet (0.4 mg total) under the tongue every 5 (five) minutes as needed for chest pain. 25 tablet 2   pantoprazole (PROTONIX) 40 MG tablet Take 1 tablet (40 mg total) by mouth daily. 90 tablet 1   traMADol (ULTRAM) 50 MG tablet Take 1 tablet (50 mg total) by mouth every 12 (twelve) hours as needed for moderate pain or severe  pain. 20 tablet 0   traZODone (DESYREL) 50 MG tablet TAKE 1 TABLET BY MOUTH AT BEDTIME 90 tablet 1   No current facility-administered medications for this visit.     Past Surgical History:  Procedure Laterality Date   CARDIAC CATHETERIZATION     CORONARY STENT INTERVENTION N/A 05/19/2021   Procedure: CORONARY STENT INTERVENTION;  Surgeon: Belva Crome, MD;  Location: [facility];  Service: Cardiovascular;  Laterality: N/A;   LEFT HEART CATH AND CORONARY ANGIOGRAPHY N/A 05/19/2021   Procedure: LEFT HEART CATH AND CORONARY ANGIOGRAPHY;  Surgeon: Belva Crome, MD;  Location: [facility];  Service: Cardiovascular;  Laterality: N/A;     Allergies  Allergen Reactions   Albuterol Anaphylaxis   Azithromycin Tinitus    Hearing loss    Penicillins Shortness Of Breath   Tadalafil Shortness Of Breath    Aspartame And Phenylalanine Other (See Comments)    HEADACHES   Topiramate Other (See Comments)    Memory loss    Buspirone Other (See Comments)    Memory loss    Hydrocodone Other (See Comments)    Tingling all over      Family History  Problem Relation Age of Onset   Sudden Cardiac Death Maternal Uncle      Social History Mr. Ruhlman reports that he quit smoking about 3 years ago. His smoking use included cigarettes. He has never used smokeless tobacco. Mr. Chaves reports that he does not currently use alcohol.   Review of Systems CONSTITUTIONAL: No weight loss, fever, chills, weakness or fatigue.  HEENT: Eyes: No visual loss, blurred vision, double vision or yellow sclerae.No hearing loss, sneezing, congestion, runny nose or sore throat.  SKIN: No rash or itching.  CARDIOVASCULAR: per hpi RESPIRATORY: No shortness of breath, cough or sputum.  GASTROINTESTINAL: No anorexia, nausea, vomiting or diarrhea. No abdominal pain or blood.  GENITOURINARY: No burning on urination, no polyuria NEUROLOGICAL: No headache, dizziness, syncope, paralysis, ataxia, numbness or tingling in the extremities. No change in bowel or bladder control.  MUSCULOSKELETAL: No muscle, back pain, joint pain or stiffness.  LYMPHATICS: No enlarged nodes. No history of splenectomy.  PSYCHIATRIC: No history of depression or anxiety.  ENDOCRINOLOGIC: No reports of sweating, cold or heat intolerance. No polyuria or polydipsia.  Marland Kitchen   Physical Examination Today's Vitals   12/11/21 1013  BP: 122/84  Pulse: (!) 101  SpO2: 97%  Weight: 195 lb 12.8 oz (88.8 kg)  Height: '5\' 10"'$  (1.778 m)   Body mass index is 28.09 kg/m.  Gen: resting comfortably, no acute distress HEENT: no scleral icterus, pupils equal round and reactive, no palptable cervical adenopathy,  CV: RRR, no m/r/g no jvd Resp: Clear to auscultation bilaterally GI: abdomen is soft, non-tender, non-distended, normal bowel sounds, no  hepatosplenomegaly MSK: extremities are warm, no edema.  Skin: warm, no rash Neuro:  no focal deficits Psych: appropriate affect   Diagnostic Studies Abd U/S 05/06/21:   Summary:  Abdominal Aorta: There is evidence of abnormal dilatation of the mid  Abdominal aorta. The largest aortic diameter has increased compared to  prior exam. Previous diameter measurement was 3.8 cm obtained on 08/21/17  by CTA.      TTE 08/22/17:   Interpretation Summary  A complete portable two-dimensional transthoracic echocardiogram with color  flow Doppler and Spectral Doppler was performed. The left ventricle is  normal in size.  There is mild concentric left ventricular hypertrophy.  The left ventricular ejection fraction is normal (  60-65%).  The left ventricular wall motion is normal.  Mild aortic sclerosis is present with good valvular opening.  There is mild aortic root dilatation.  The aortic valve is trileaflet.  Grade I mild diastolic dysfunction; abnormal relaxation pattern.  Estimation of right ventricular systolic pressure is not possible.      LHC 01/16/11:   CONCLUSIONS:  1.  Single vessel obstructive coronary artery disease in the setting of an  ST elevation myocardial infarction.  2.  Status post PCI to the proximal and mid RCA with a 3.0 x 28 and 3.5 x  12 mm vision bare-metal stents.  Lesion length was 30 mm, eccentric, mild  calcium, definite thrombus.  Lesion went from a 100% pre to 0% post  residual.  Pre-TIMI flow was 0, post-TIMI flow was 3.  3.  Right radial access.  4.  Preserved left ventricular function.    05/2021 cath Diffuse in-stent restenosis proximal to mid RCA with worse region up to 95%. Angioplasty/scoring balloon followed by restenting starting beyond the margin of the previously placed stent and extending back to the proximal segment reducing to 0% stenosis with TIMI grade III flow. Widely patent left main LAD beyond the newly placed stent contains eccentric  60% stenosis and tandem 30 to 40% stenoses in the distal segment. 50 to 60% mid circumflex. LV function is normal.  LVEDP 11 mmHg.   RECOMMENDATIONS:   Dual antiplatelet therapy for 1 year then drop aspirin and continue clopidogrel indefinitely. Aggressive risk factor modification with LDL lowering to less than 55. Eligible for discharge in a.m. unless recurrent symptoms.     05/2021 echo  IMPRESSIONS     1. Left ventricular ejection fraction, by estimation, is 55 to 60%. The  left ventricle has normal function. The left ventricle has no regional  wall motion abnormalities. Left ventricular diastolic parameters were  normal.   2. Right ventricular systolic function is normal. The right ventricular  size is normal. There is normal pulmonary artery systolic pressure. The  estimated right ventricular systolic pressure is 76.5 mmHg.   3. The mitral valve is grossly normal. No evidence of mitral valve  regurgitation. No evidence of mitral stenosis.   4. The aortic valve is tricuspid. Aortic valve regurgitation is trivial.  No aortic stenosis is present.   5. The inferior vena cava is normal in size with <50% respiratory  variability, suggesting right atrial pressure of 8 mmHg.     Assessment and Plan  1.CAD - recent stenting as reported above - mild infrequent chest pains, agree with starting imdur '30mg'$  daily that his pcp had prescribed - 05/2022 would stop ASA and continue plavix alone per interventional cards recs   2. Hyperlipidemia - LDL was above goal during admisison, atorva was increased to '80mg'$  daily - repeat lipid panel   3. HTN - he is at goal, continue current meds          Arnoldo Lenis, M.D.

## 2021-12-11 NOTE — Patient Instructions (Signed)
Medication Instructions:  Your physician has recommended you make the following change in your medication:   -Increase Protonix to 40 mg tablets twice daily   Labwork: -Fasting Lipid Panel- Nothing to eat/drink 8 hours prior to having lab drawn  Testing/Procedures: None  Follow-Up: Follow up with Dr. Harl Bowie in February 2024.  Any Other Special Instructions Will Be Listed Below (If Applicable).     If you need a refill on your cardiac medications before your next appointment, please call your pharmacy.

## 2021-12-17 ENCOUNTER — Ambulatory Visit (INDEPENDENT_AMBULATORY_CARE_PROVIDER_SITE_OTHER): Payer: Medicare HMO | Admitting: Clinical

## 2021-12-17 DIAGNOSIS — F4323 Adjustment disorder with mixed anxiety and depressed mood: Secondary | ICD-10-CM | POA: Diagnosis not present

## 2021-12-17 DIAGNOSIS — R69 Illness, unspecified: Secondary | ICD-10-CM | POA: Diagnosis not present

## 2021-12-17 NOTE — Progress Notes (Signed)
IN PERSON   I connected with Jeremy Jenkins on 12/17/21 at  11:00 AM EDT in person and verified that I am speaking with the correct person using two identifiers.   Location: Patient: Office Provider: Office    I discussed the limitations of evaluation and management by telemedicine and the availability of in person appointments. The patient expressed understanding and agreed to proceed.       THERAPIST PROGRESS NOTE   Session Time: 11:00 AM-11:45 AM   Participation Level: Active   Behavioral Response: CasualAlertDepressed   Type of Therapy: Individual Therapy   Treatment Goals addressed: Coping Mood Management   Interventions: CBT, Motivational Interviewing, Solution Focused and Supportive   Summary: Jeremy Jenkins  is a 71 y.o. male who presents with Adjustment Disorder. The OPT therapist worked with the patient for his scheduled session. The OPT therapist utilized Motivational Interviewing to assist in creating therapeutic repore. The patient reviewed his ongoing work to adjust with stage of life changes including ailing health, since of finding purpose, and dealing with being alone during times his current partner/wife is not at home with him. The patient spoke about the pattern he previously feel in of coping by using alcohol. The OPT therapist worked with the patient in the session overviewing other coping strategies that are positive coping mechanisms. The patient identified his partner has since the last session gotten the contract to build a Engineer, manufacturing for the city of [employer] and he has linked with the [employer] center which allows his partner to work and gives him connection to leisure and socialization. while his partner is working.The OPT therapist utilized Cognitive Behavioral Therapy through cognitive restructuring as well as worked with the patient on coping strategies to assist in management of mood. The patient spoke about his willingness to open  up around pasty unresolved traumatic situation of his last wife passing.   Suicidal/Homicidal: Nowithout intent/plan   Therapist Response: The OPT therapist worked with the patient for the patients scheduled session. The patient was engaged in his session and gave feedback in relation to triggers, symptoms, and behavior responses over the past few weeks. The OPT therapist worked with the patient utilizing an in session Cognitive Behavioral Therapy exercise. The patient was responsive in the session and verbalized, " I was extremely happy my partner got the contract for [employer] and this is her wheelhouse in creating commercial kitchens and I am onboard and I will be there to help and support her". The patient expressed the he is looking forward to being involved with the commercial kitchen as he has a past career in Radio broadcast assistant, but would work not to impose but be a Theatre manager for his partners project.The OPT therapist will continue treatment work with the patient in his next scheduled session.     Plan: Return again in 2/3 weeks.   Diagnosis:      Axis I: Adjustment Disorder                           Axis II: No diagnosis     Collaboration of Care: No additional collaboration of care.    Patient/Guardian was advised Release of Information must be obtained prior to any record release in order to collaborate their care with an outside provider. Patient/Guardian was advised if they have not already done so to contact the registration department to sign all necessary forms in order for Jeremy Jenkins to release information  regarding their care.    Consent: Patient/Guardian gives verbal consent for treatment and assignment of benefits for services provided during this visit. Patient/Guardian expressed understanding and agreed to proceed.    I discussed the assessment and treatment plan with the patient. The patient was provided an opportunity to ask questions and all were answered. The patient agreed with  the plan and demonstrated an understanding of the instructions.   The patient was advised to call back or seek an in-person evaluation if the symptoms worsen or if the condition fails to improve as anticipated.   I provided 45 minutes of face-to-face time during this encounter.   Lennox Grumbles, LCSW   12/17/2021

## 2021-12-29 ENCOUNTER — Telehealth: Payer: Self-pay | Admitting: *Deleted

## 2021-12-29 MED ORDER — ROSUVASTATIN CALCIUM 40 MG PO TABS: 40.0000 mg | ORAL_TABLET | Freq: Every day | ORAL | 3 refills | Status: DC

## 2021-12-29 NOTE — Telephone Encounter (Signed)
-----   Message from Laurine Blazer, LPN sent at 12/07/8331  5:59 PM EDT -----  ----- Message ----- From: Arnoldo Lenis, MD Sent: 12/25/2021  12:10 PM EDT To: Laurine Blazer, LPN  Cholesterol remains high, can he stop atorvastatin and start crestor '40mg'$  daily  Zandra Abts MD

## 2021-12-29 NOTE — Telephone Encounter (Signed)
Pt notified and orders placed  

## 2021-12-31 ENCOUNTER — Ambulatory Visit (INDEPENDENT_AMBULATORY_CARE_PROVIDER_SITE_OTHER): Payer: Medicare HMO | Admitting: Clinical

## 2021-12-31 DIAGNOSIS — F4323 Adjustment disorder with mixed anxiety and depressed mood: Secondary | ICD-10-CM | POA: Diagnosis not present

## 2021-12-31 DIAGNOSIS — R69 Illness, unspecified: Secondary | ICD-10-CM | POA: Diagnosis not present

## 2021-12-31 NOTE — Progress Notes (Signed)
IN PERSON   I connected with Jeremy Jenkins on 12/31/21 at  11:00 AM EDT in person and verified that I am speaking with the correct person using two identifiers.   Location: Patient: Office Provider: Office    I discussed the limitations of evaluation and management by telemedicine and the availability of in person appointments. The patient expressed understanding and agreed to proceed.       THERAPIST PROGRESS NOTE   Session Time: 11:00 AM-11:55 AM   Participation Level: Active   Behavioral Response: CasualAlertDepressed   Type of Therapy: Individual Therapy   Treatment Goals addressed: Coping Mood Management   Interventions: CBT, Motivational Interviewing, Solution Focused and Supportive   Summary: Jeremy Jenkins  is a 71 y.o. male who presents with Adjustment Disorder. The OPT therapist worked with the patient for his scheduled session. The OPT therapist utilized Motivational Interviewing to assist in creating therapeutic repore. The patient reviewed his ongoing work to adjust with stage of life changes including ailing health, since of finding purpose, and dealing with ongoing elements from his wife passing a few years ago. The patient spoke about his awareness of how the tragic lose of his wife a few years ago left him emotionally vunerable and this led to some impulsive decisions that the patient currently regrets including selling his home and going on a "joy ride" for 18 months across the county in a RV. The OPT therapist worked with the patient in the session overviewing the impact of prior events on his current situation. The patient identified his current wife has gotten the contract officially for the building of a Engineer, manufacturing in [employer] this along with her ambition of opening a wedding business has kept the patient busy but he identified his payoff is seeing his partner happy.The OPT therapist utilized Cognitive Behavioral Therapy through cognitive  restructuring as well as worked with the patient on coping strategies to assist in management of mood.    Suicidal/Homicidal: Nowithout intent/plan   Therapist Response: The OPT therapist worked with the patient for the patients scheduled session. The patient was engaged in his session and gave feedback in relation to triggers, symptoms, and behavior responses over the past few weeks. The OPT therapist worked with the patient utilizing an in session Cognitive Behavioral Therapy exercise. The patient was responsive in the session and verbalized, " I still very much regret selling my house as this would have provided financial freedom having a place that was paid off already.".The patient spoke about the process of selling his home ,getting an RV and traveling for 18 months, before buying a new home here in [place], [place] this creating a financial challenge that would not exist if he did not previously sell his home before. The patient expressed the he is looking forward to being involved with the commercial kitchen as he has a past career in Radio broadcast assistant.The OPT therapist will continue treatment work with the patient in his next scheduled session.     Plan: Return again in 2/3 weeks.   Diagnosis:      Axis I: Adjustment Disorder                           Axis II: No diagnosis     Collaboration of Care: No additional collaboration of care.    Patient/Guardian was advised Release of Information must be obtained prior to any record release in order to collaborate their care with an  outside provider. Patient/Guardian was advised if they have not already done so to contact the registration department to sign all necessary forms in order for [place] to release information regarding their care.    Consent: Patient/Guardian gives verbal consent for treatment and assignment of benefits for services provided during this visit. Patient/Guardian expressed understanding and agreed to proceed.    I discussed  the assessment and treatment plan with the patient. The patient was provided an opportunity to ask questions and all were answered. The patient agreed with the plan and demonstrated an understanding of the instructions.   The patient was advised to call back or seek an in-person evaluation if the symptoms worsen or if the condition fails to improve as anticipated.   I provided 55 minutes of face-to-face time during this encounter.   Lennox Grumbles, LCSW   12/31/2021

## 2022-01-11 ENCOUNTER — Other Ambulatory Visit: Payer: Self-pay | Admitting: Internal Medicine

## 2022-01-11 DIAGNOSIS — M5136 Other intervertebral disc degeneration, lumbar region: Secondary | ICD-10-CM

## 2022-01-28 ENCOUNTER — Ambulatory Visit (INDEPENDENT_AMBULATORY_CARE_PROVIDER_SITE_OTHER): Payer: Medicare HMO | Admitting: Clinical

## 2022-01-28 DIAGNOSIS — F4323 Adjustment disorder with mixed anxiety and depressed mood: Secondary | ICD-10-CM | POA: Diagnosis not present

## 2022-01-28 DIAGNOSIS — R69 Illness, unspecified: Secondary | ICD-10-CM | POA: Diagnosis not present

## 2022-01-28 NOTE — Progress Notes (Signed)
IN PERSON   I connected with Jeremy Jenkins on 01/28/22 at  11:00 AM EDT in person and verified that I am speaking with the correct person using two identifiers.   Location: Patient: Office Provider: Office    I discussed the limitations of evaluation and management by telemedicine and the availability of in person appointments. The patient expressed understanding and agreed to proceed.       THERAPIST PROGRESS NOTE   Session Time: 11:00 AM-11:30 AM   Participation Level: Active   Behavioral Response: CasualAlertDepressed   Type of Therapy: Individual Therapy   Treatment Goals addressed: Coping Mood Management   Interventions: CBT, Motivational Interviewing, Solution Focused and Supportive   Summary: Jeremy Jenkins  is a 71 y.o. male who presents with Adjustment Disorder. The OPT therapist worked with the patient for his scheduled session. The OPT therapist utilized Motivational Interviewing to assist in creating therapeutic repore. The patient reviewed his ongoing work to adjust with stage of life changes including ailing health, since of finding purpose, and dealing with ongoing elements from his wife passing a few years ago. The patient spoke about his awareness of how the tragic lose of his wife a few years ago left him emotionally vunerable and this led to some impulsive decisions that the patient currently regrets including selling his home and going on a "joy ride" for 18 months across the county in a RV. The patient in this session spoke about recent trips with his current partner to the [facility] and then to [facility]. The patient in this session spoke about currently looking to buy an RV. The OPT therapist worked with the patient in the session overviewing the impact of prior events on his current situation. The patient identified work with his partners Engineer, manufacturing in [employer] this will most likely be slow until after the new year.The OPT therapist  utilized Cognitive Behavioral Therapy through cognitive restructuring as well as worked with the patient on coping strategies to assist in management of mood.   Suicidal/Homicidal: Nowithout intent/plan   Therapist Response: The OPT therapist worked with the patient for the patients scheduled session. The patient was engaged in his session and gave feedback in relation to triggers, symptoms, and behavior responses over the past few weeks. The OPT therapist worked with the patient utilizing an in session Cognitive Behavioral Therapy exercise. The patient was responsive in the session and verbalized, " We have been busy over the last few weeks traveling we went to the [facility] and then visited her daughter in [place].".The patient spoke about currently looking to purchase an RV. The patient spoke about his awareness in making decisions around finances. The OPT therapist worked with the patient evaluating the impact of his partners decisions on his stress levels and mood.The OPT therapist will continue treatment work with the patient in his next scheduled session.     Plan: Return again in 2/3 weeks.   Diagnosis:      Axis I: Adjustment Disorder                           Axis II: No diagnosis     Collaboration of Care: No additional collaboration of care.    Patient/Guardian was advised Release of Information must be obtained prior to any record release in order to collaborate their care with an outside provider. Patient/Guardian was advised if they have not already done so to contact the registration  department to sign all necessary forms in order for [place] to release information regarding their care.    Consent: Patient/Guardian gives verbal consent for treatment and assignment of benefits for services provided during this visit. Patient/Guardian expressed understanding and agreed to proceed.    I discussed the assessment and treatment plan with the patient. The patient was provided an  opportunity to ask questions and all were answered. The patient agreed with the plan and demonstrated an understanding of the instructions.   The patient was advised to call back or seek an in-person evaluation if the symptoms worsen or if the condition fails to improve as anticipated.   I provided 30 minutes of face-to-face time during this encounter.   Lennox Grumbles, LCSW   01/28/2022

## 2022-02-11 ENCOUNTER — Other Ambulatory Visit: Payer: Self-pay | Admitting: Cardiology

## 2022-03-18 ENCOUNTER — Other Ambulatory Visit: Payer: Self-pay | Admitting: Internal Medicine

## 2022-03-19 ENCOUNTER — Ambulatory Visit (INDEPENDENT_AMBULATORY_CARE_PROVIDER_SITE_OTHER): Payer: Medicare HMO | Admitting: Internal Medicine

## 2022-03-19 ENCOUNTER — Other Ambulatory Visit: Payer: Self-pay | Admitting: Internal Medicine

## 2022-03-19 ENCOUNTER — Encounter: Payer: Self-pay | Admitting: Internal Medicine

## 2022-03-19 VITALS — BP 121/79 | HR 88 | Ht 70.0 in | Wt 201.6 lb

## 2022-03-19 DIAGNOSIS — I25110 Atherosclerotic heart disease of native coronary artery with unstable angina pectoris: Secondary | ICD-10-CM | POA: Diagnosis not present

## 2022-03-19 DIAGNOSIS — R7303 Prediabetes: Secondary | ICD-10-CM | POA: Diagnosis not present

## 2022-03-19 DIAGNOSIS — N529 Male erectile dysfunction, unspecified: Secondary | ICD-10-CM

## 2022-03-19 DIAGNOSIS — I10 Essential (primary) hypertension: Secondary | ICD-10-CM | POA: Diagnosis not present

## 2022-03-19 DIAGNOSIS — Z0001 Encounter for general adult medical examination with abnormal findings: Secondary | ICD-10-CM | POA: Diagnosis not present

## 2022-03-19 DIAGNOSIS — F5101 Primary insomnia: Secondary | ICD-10-CM

## 2022-03-19 DIAGNOSIS — Z1159 Encounter for screening for other viral diseases: Secondary | ICD-10-CM

## 2022-03-19 DIAGNOSIS — Z122 Encounter for screening for malignant neoplasm of respiratory organs: Secondary | ICD-10-CM | POA: Insufficient documentation

## 2022-03-19 DIAGNOSIS — E782 Mixed hyperlipidemia: Secondary | ICD-10-CM

## 2022-03-19 DIAGNOSIS — E559 Vitamin D deficiency, unspecified: Secondary | ICD-10-CM | POA: Diagnosis not present

## 2022-03-19 NOTE — Assessment & Plan Note (Addendum)
On statin Checked lipid profile, overall improving

## 2022-03-19 NOTE — Assessment & Plan Note (Addendum)
BP Readings from Last 1 Encounters:  03/19/22 121/79   Well-controlled, on metoprolol for CAD Counseled for compliance with the medications Advised DASH diet and moderate exercise/walking, at least 150 mins/week

## 2022-03-19 NOTE — Assessment & Plan Note (Signed)
Has tried Viagra and Cialis without adequate response Referred to urology

## 2022-03-19 NOTE — Assessment & Plan Note (Signed)
Advised to follow low carb diet for now 

## 2022-03-19 NOTE — Progress Notes (Signed)
Established Patient Office Visit  Subjective:  Patient ID: Jeremy Jenkins, male    DOB: 1950/07/28  Age: 71 y.o. MRN: [id number]  CC:  Chief Complaint  Patient presents with   Annual Exam    HPI Jeremy Jenkins is a 71 y.o. male with past medical history of CAD s/p stent placement, HLD and aortic aneurysm who presents for annual physical.  CAD: He has been taking aspirin, Plavix and statin.  He had complains of slight pain on his left arm, but has tried taking nitroglycerin only once.  He was given Imdur for anginal chest pain, but had severe headache with it and had to stop taking it.  He reports that his chest pain episodes have resolved now. He denies any dyspnea or palpitations currently. BP is wnl.  His nasal congestion and postnasal drip have improved with Xyzal.  Denies any fever or chills currently.  He takes trazodone as needed for insomnia.  He has talked to [facility] therapist for GAD.  Denies any SI or HI currently.  He reports persistent erectile dysfunction concern despite trying Viagra and Cialis in the past.  He denies any dysuria, hematuria or urethral discharge currently.   Past Medical History:  Diagnosis Date   NSTEMI (non-ST elevated myocardial infarction) ([place]) 05/19/2021   Old myocardial infarction 09/11/2013   Formatting of this note might be different from the original. Overview:  01/2011 Formatting of this note might be different from the original. 01/2011    Past Surgical History:  Procedure Laterality Date   CARDIAC CATHETERIZATION     CORONARY STENT INTERVENTION N/A 05/19/2021   Procedure: CORONARY STENT INTERVENTION;  Surgeon: Belva Crome, MD;  Location: [place] [facility];  Service: Cardiovascular;  Laterality: N/A;   LEFT HEART CATH AND CORONARY ANGIOGRAPHY N/A 05/19/2021   Procedure: LEFT HEART CATH AND CORONARY ANGIOGRAPHY;  Surgeon: Belva Crome, MD;  Location: [place] [facility];  Service: Cardiovascular;  Laterality: N/A;    Family History   Problem Relation Age of Onset   Sudden Cardiac Death Maternal Uncle     Social History   Socioeconomic History   Marital status: Single    Spouse name: Not on file   Number of children: Not on file   Years of education: Not on file   Highest education level: Not on file  Occupational History   Not on file  Tobacco Use   Smoking status: Former    Types: Cigarettes    Quit date: 06/02/2018    Years since quitting: 3.7   Smokeless tobacco: Never  Vaping Use   Vaping Use: Never used  Substance and Sexual Activity   Alcohol use: Not Currently   Drug use: Never   Sexual activity: Not on file  Other Topics Concern   Not on file  Social History Narrative   Not on file   Social Determinants of Health   Financial Resource Strain: Not on file  Food Insecurity: Not on file  Transportation Needs: Not on file  Physical Activity: Not on file  Stress: Not on file  Social Connections: Not on file  Intimate Partner Violence: Not on file    Outpatient Medications Prior to Visit  Medication Sig Dispense Refill   aspirin EC 81 MG EC tablet Take 1 tablet (81 mg total) by mouth daily. Swallow whole. 90 tablet 2   azelastine (ASTELIN) 0.1 % nasal spray Place 2 sprays into both nostrils 2 (two) times daily. Use in each nostril  as directed 30 mL 12   clopidogrel (PLAVIX) 75 MG tablet Take 1 tablet by mouth once daily with breakfast 90 tablet 1   fluticasone (FLONASE) 50 MCG/ACT nasal spray Place 1 spray into both nostrils daily.     levocetirizine (XYZAL) 5 MG tablet Take 1 tablet (5 mg total) by mouth every evening. 30 tablet 11   magnesium 30 MG tablet Take 30 mg by mouth daily.     metoprolol succinate (TOPROL-XL) 50 MG 24 hr tablet TAKE 1 TABLET BY MOUTH ONCE DAILY WITH  OR  IMMEDIATELY  FOLLOWING  A  MEAL 90 tablet 0   nitroGLYCERIN (NITROSTAT) 0.4 MG SL tablet Place 1 tablet (0.4 mg total) under the tongue every 5 (five) minutes as needed for chest pain. 25 tablet 2   pantoprazole  (PROTONIX) 40 MG tablet Take 1 tablet (40 mg total) by mouth 2 (two) times daily. 180 tablet 3   rosuvastatin (CRESTOR) 40 MG tablet Take 1 tablet (40 mg total) by mouth daily. 90 tablet 3   traMADol (ULTRAM) 50 MG tablet TAKE 1 TABLET BY MOUTH EVERY 12 HOURS AS NEEDED FOR MODERATE PAIN OR SEVERE PAIN 20 tablet 0   traZODone (DESYREL) 50 MG tablet TAKE 1 TABLET BY MOUTH AT BEDTIME 90 tablet 1   isosorbide mononitrate (IMDUR) 30 MG 24 hr tablet Take 1 tablet (30 mg total) by mouth daily. 30 tablet 5   No facility-administered medications prior to visit.    Allergies  Allergen Reactions   Albuterol Anaphylaxis   Azithromycin Tinitus    Hearing loss    Penicillins Shortness Of Breath   Tadalafil Shortness Of Breath   Aspartame And Phenylalanine Other (See Comments)    HEADACHES   Topiramate Other (See Comments)    Memory loss    Buspirone Other (See Comments)    Memory loss    Hydrocodone Other (See Comments)    Tingling all over    ROS Review of Systems  Constitutional:  Negative for chills and fever.  HENT:  Positive for congestion, postnasal drip, rhinorrhea and tinnitus. Negative for sore throat.   Eyes:  Negative for pain and discharge.  Respiratory:  Negative for cough and shortness of breath.   Cardiovascular:  Negative for chest pain and palpitations.  Gastrointestinal:  Negative for diarrhea, nausea and vomiting.  Endocrine: Negative for polydipsia and polyuria.  Genitourinary:  Negative for dysuria and hematuria.  Musculoskeletal:  Positive for back pain. Negative for neck pain and neck stiffness.  Skin:  Negative for rash.       Mole on back  Neurological:  Negative for dizziness, weakness, numbness and headaches.  Psychiatric/Behavioral:  Positive for sleep disturbance. Negative for agitation and behavioral problems.       Objective:    Physical Exam Vitals reviewed.  Constitutional:      General: He is not in acute distress.    Appearance: He is not  diaphoretic.  HENT:     Head: Normocephalic and atraumatic.     Nose: No congestion.     Mouth/Throat:     Mouth: Mucous membranes are moist.  Eyes:     General: No scleral icterus.    Extraocular Movements: Extraocular movements intact.  Cardiovascular:     Rate and Rhythm: Normal rate and regular rhythm.     Pulses: Normal pulses.     Heart sounds: Normal heart sounds. No murmur heard. Pulmonary:     Breath sounds: Normal breath sounds. No wheezing or rales.  Abdominal:     Palpations: Abdomen is soft.     Tenderness: There is no abdominal tenderness.  Musculoskeletal:     Cervical back: Neck supple. No tenderness.     Right lower leg: No edema.     Left lower leg: No edema.  Skin:    General: Skin is warm.     Findings: No lesion.  Neurological:     General: No focal deficit present.     Mental Status: He is alert and oriented to person, place, and time.     Sensory: No sensory deficit.     Motor: No weakness.  Psychiatric:        Mood and Affect: Mood normal.        Behavior: Behavior normal.     BP 121/79 (BP Location: Left Arm, Patient Position: Sitting, Cuff Size: Normal)   Pulse 88   Ht _0  (1.778 m)   Wt 201 lb 9.6 oz (91.4 kg)   SpO2 97%   BMI 28.93 kg/m  Wt Readings from Last 3 Encounters:  03/19/22 201 lb 9.6 oz (91.4 kg)  12/11/21 195 lb 12.8 oz (88.8 kg)  11/18/21 198 lb 9.6 oz (90.1 kg)    Lab Results  Component Value Date   TSH 3.177 05/19/2021   Lab Results  Component Value Date   WBC 10.6 09/15/2021   HGB 11.7 (L) 09/15/2021   HCT 35.5 (L) 09/15/2021   MCV 90 09/15/2021   PLT 209 09/15/2021   Lab Results  Component Value Date   NA 139 09/15/2021   K 4.4 09/15/2021   CO2 23 09/15/2021   GLUCOSE 124 (H) 09/15/2021   BUN 13 09/15/2021   CREATININE 1.13 09/15/2021   BILITOT 0.6 09/15/2021   ALKPHOS 99 09/15/2021   AST 15 09/15/2021   ALT 7 09/15/2021   PROT 6.4 09/15/2021   ALBUMIN 4.2 09/15/2021   CALCIUM 9.5 09/15/2021    ANIONGAP 7 05/20/2021   EGFR 69 09/15/2021   Lab Results  Component Value Date   CHOL 128 12/11/2021   Lab Results  Component Value Date   HDL 29 (L) 12/11/2021   Lab Results  Component Value Date   LDLCALC 80 12/11/2021   Lab Results  Component Value Date   TRIG 95 12/11/2021   Lab Results  Component Value Date   CHOLHDL 4.4 12/11/2021   Lab Results  Component Value Date   HGBA1C 5.7 (H) 05/19/2021      Assessment & Plan:   Problem List Items Addressed This Visit       Cardiovascular and Mediastinum   Benign essential hypertension    BP Readings from Last 1 Encounters:  03/19/22 121/79  Well-controlled, on metoprolol for CAD Counseled for compliance with the medications Advised DASH diet and moderate exercise/walking, at least 150 mins/week      Relevant Orders   CMP14+EGFR   CBC with Differential/Platelet   Coronary artery disease    Recently had cardiac cath  - diffuse in-stent restenosis proximal to mid RCA with worse region up to 95%. Angioplasty/scoring balloon followed by restenting starting beyond the margin of the previously placed stent and extending back to the proximal segment reducing to 0% stenosis with TIMI grade III flow.  On DAPT and statin currently Advised to take nitroglycerin as needed for chest pain or left arm pain Had added Imdur for stable anginal pain, but he had severe headache with it, currently denies chest pain      Relevant  Orders   CMP14+EGFR   CBC with Differential/Platelet     Other   Erectile dysfunction    Has tried Viagra and Cialis without adequate response Referred to urology      Relevant Orders   Ambulatory referral to Urology   Prediabetes    Advised to follow low-carb diet for now      Relevant Orders   Hemoglobin A1c   Vitamin D deficiency   Relevant Orders   VITAMIN D 25 Hydroxy (Vit-D Deficiency, Fractures)   Mixed hyperlipidemia    On statin Checked lipid profile, overall improving       Encounter for general adult medical examination with abnormal findings - Primary    Physical exam as documented. Fasting blood tests ordered. Advised to get Shingrix and Tdap vaccines at local pharmacy.      Screening for lung cancer    Has 25 pack-year smoking history, quit in 2020 Ordered low-dose CT chest      Relevant Orders   CT CHEST LUNG CANCER SCREENING LOW DOSE WO CONTRAST   Other Visit Diagnoses     Need for hepatitis C screening test       Relevant Orders   Hepatitis C Antibody       No orders of the defined types were placed in this encounter.   Follow-up: No follow-ups on file.    Lindell Spar, MD

## 2022-03-19 NOTE — Assessment & Plan Note (Addendum)
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 Angioplasty/scoring balloon followed by restenting starting beyond the margin of the previously placed stent and extending back to the proximal segment reducing to 0% stenosis with TIMI grade III flow.  On DAPT and statin currently Advised to take nitroglycerin as needed for chest pain or left arm pain Had added Imdur for stable anginal pain, but he had severe headache with it, currently denies chest pain

## 2022-03-19 NOTE — Patient Instructions (Addendum)
Please continue to take medications as prescribed.  Please continue to follow low salt diet and ambulate as tolerated.  Please consider getting Shingrix and Tdap vaccines at local pharmacy.

## 2022-03-19 NOTE — Assessment & Plan Note (Signed)
Has 25 pack-year smoking history, quit in 2020 Ordered low-dose CT chest

## 2022-03-19 NOTE — Assessment & Plan Note (Signed)
Physical exam as documented. Fasting blood tests ordered. Advised to get Shingrix and Tdap vaccines at local pharmacy. 

## 2022-03-20 LAB — CBC WITH DIFFERENTIAL/PLATELET
Basophils Absolute: 0.2 10*3/uL (ref 0.0–0.2)
Basos: 2 %
EOS (ABSOLUTE): 0.6 10*3/uL — ABNORMAL HIGH (ref 0.0–0.4)
Eos: 7 %
Hematocrit: 35.4 % — ABNORMAL LOW (ref 37.5–51.0)
Hemoglobin: 10.4 g/dL — ABNORMAL LOW (ref 13.0–17.7)
Immature Grans (Abs): 0 10*3/uL (ref 0.0–0.1)
Immature Granulocytes: 0 %
Lymphocytes Absolute: 2.5 10*3/uL (ref 0.7–3.1)
Lymphs: 32 %
MCH: 21.4 pg — ABNORMAL LOW (ref 26.6–33.0)
MCHC: 29.4 g/dL — ABNORMAL LOW (ref 31.5–35.7)
MCV: 73 fL — ABNORMAL LOW (ref 79–97)
Monocytes Absolute: 0.8 10*3/uL (ref 0.1–0.9)
Monocytes: 10 %
Neutrophils Absolute: 3.9 10*3/uL (ref 1.4–7.0)
Neutrophils: 49 %
Platelets: 231 10*3/uL (ref 150–450)
RBC: 4.87 x10E6/uL (ref 4.14–5.80)
RDW: 15.9 % — ABNORMAL HIGH (ref 11.6–15.4)
WBC: 8 10*3/uL (ref 3.4–10.8)

## 2022-03-20 LAB — CMP14+EGFR
ALT: 6 IU/L (ref 0–44)
AST: 15 IU/L (ref 0–40)
Albumin/Globulin Ratio: 1.6 (ref 1.2–2.2)
Albumin: 4.1 g/dL (ref 3.8–4.8)
Alkaline Phosphatase: 69 IU/L (ref 44–121)
BUN/Creatinine Ratio: 15 (ref 10–24)
BUN: 15 mg/dL (ref 8–27)
Bilirubin Total: 0.5 mg/dL (ref 0.0–1.2)
CO2: 22 mmol/L (ref 20–29)
Calcium: 8.8 mg/dL (ref 8.6–10.2)
Chloride: 105 mmol/L (ref 96–106)
Creatinine, Ser: 0.99 mg/dL (ref 0.76–1.27)
Globulin, Total: 2.6 g/dL (ref 1.5–4.5)
Glucose: 125 mg/dL — ABNORMAL HIGH (ref 70–99)
Potassium: 5 mmol/L (ref 3.5–5.2)
Sodium: 141 mmol/L (ref 134–144)
Total Protein: 6.7 g/dL (ref 6.0–8.5)
eGFR: 81 mL/min/{1.73_m2} (ref >59)

## 2022-03-20 LAB — VITAMIN D 25 HYDROXY (VIT D DEFICIENCY, FRACTURES): Vit D, 25-Hydroxy: 35.9 ng/mL (ref 30.0–100.0)

## 2022-03-20 LAB — HEMOGLOBIN A1C
Est. average glucose Bld gHb Est-mCnc: 143 mg/dL
Hgb A1c MFr Bld: 6.6 % — ABNORMAL HIGH (ref 4.8–5.6)

## 2022-03-20 LAB — HEPATITIS C ANTIBODY: Hep C Virus Ab: NONREACTIVE

## 2022-03-22 ENCOUNTER — Telehealth: Payer: Self-pay | Admitting: Internal Medicine

## 2022-03-22 NOTE — Telephone Encounter (Signed)
Patient left voicemail returning heather call.

## 2022-03-23 ENCOUNTER — Other Ambulatory Visit: Payer: Self-pay | Admitting: Internal Medicine

## 2022-03-23 DIAGNOSIS — F5101 Primary insomnia: Secondary | ICD-10-CM

## 2022-03-23 MED ORDER — METOPROLOL SUCCINATE ER 50 MG PO TB24: 50.0000 mg | ORAL_TABLET | Freq: Every day | ORAL | 1 refills | Status: DC

## 2022-03-23 MED ORDER — TRAZODONE HCL 50 MG PO TABS: 50.0000 mg | ORAL_TABLET | Freq: Every day | ORAL | 0 refills | Status: DC

## 2022-03-24 ENCOUNTER — Other Ambulatory Visit: Payer: Self-pay | Admitting: Internal Medicine

## 2022-03-24 DIAGNOSIS — F5101 Primary insomnia: Secondary | ICD-10-CM

## 2022-04-22 ENCOUNTER — Ambulatory Visit: Payer: Medicare HMO | Admitting: Urology

## 2022-05-06 ENCOUNTER — Ambulatory Visit (HOSPITAL_COMMUNITY)
Admission: RE | Admit: 2022-05-06 | Discharge: 2022-05-06 | Disposition: A | Discharge status: Home / Self Care | Payer: Medicare HMO | Source: Ambulatory Visit | Attending: Internal Medicine | Admitting: Internal Medicine

## 2022-05-06 DIAGNOSIS — I700 Atherosclerosis of aorta: Secondary | ICD-10-CM | POA: Diagnosis not present

## 2022-05-06 DIAGNOSIS — J439 Emphysema, unspecified: Secondary | ICD-10-CM | POA: Diagnosis not present

## 2022-05-06 DIAGNOSIS — Z87891 Personal history of nicotine dependence: Secondary | ICD-10-CM | POA: Insufficient documentation

## 2022-05-06 DIAGNOSIS — K8020 Calculus of gallbladder without cholecystitis without obstruction: Secondary | ICD-10-CM | POA: Diagnosis not present

## 2022-05-06 DIAGNOSIS — Z122 Encounter for screening for malignant neoplasm of respiratory organs: Secondary | ICD-10-CM | POA: Diagnosis not present

## 2022-05-20 ENCOUNTER — Ambulatory Visit: Payer: Medicare HMO | Attending: Cardiology | Admitting: Cardiology

## 2022-05-20 ENCOUNTER — Encounter: Payer: Self-pay | Admitting: Cardiology

## 2022-05-20 VITALS — BP 126/82 | HR 93 | Ht 70.0 in | Wt 196.6 lb

## 2022-05-20 DIAGNOSIS — I25118 Atherosclerotic heart disease of native coronary artery with other forms of angina pectoris: Secondary | ICD-10-CM | POA: Diagnosis not present

## 2022-05-20 DIAGNOSIS — E782 Mixed hyperlipidemia: Secondary | ICD-10-CM | POA: Diagnosis not present

## 2022-05-20 DIAGNOSIS — I10 Essential (primary) hypertension: Secondary | ICD-10-CM

## 2022-05-20 NOTE — Patient Instructions (Signed)
Medication Instructions:   STOP Aspirin   Labwork: Fasting Lipids  Testing/Procedures: None today  Follow-Up: 6 months  Any Other Special Instructions Will Be Listed Below (If Applicable).  If you need a refill on your cardiac medications before your next appointment, please call your pharmacy.

## 2022-05-20 NOTE — Progress Notes (Signed)
Clinical Summary [name] is a 72 y.o.male seen today for follow up of the following medical problems.      1.CAD - history of prior STEMI with PCI to RCA 01/2011 - admit 05/2021 with chest pain. Mild trop 55.  - 05/2021 cath: diffuse ISR RCA stent, repeated stenting of RCA. Interventional recommended DAPT x 1 year then stop ASA and continue plavix - 05/2021 echo: LVEF 55-60%, no WMAs       05/2022 would stop ASA and continue plavix alone per interventional cards recs - no chest pains, no SOB/DOE.   2. HTN - he is compliant with meds     3. Hyperlipidemia 12/2021 cholesterol too high, changed to crestor 32m daily - needs repeat panel     4. AAA - 4.8 cm by 05/2021 [place]- followed by vascular Dr Jeremy Jenkins  - given number to arrange f/u     5. EtoH abuse - prior history mentioend in chart      SH: recent sold house, bought camper and [place]  Just bought a new camping trailer.  Past Medical History:  Diagnosis Date   [place]   Old myocardial infarction 09/11/2013   Formatting of this note might be different from the original. Overview:  01/2011 Formatting of this note might be different from the original. 01/2011     Allergies  Allergen Reactions   Albuterol Anaphylaxis   Azithromycin Tinitus    Hearing loss    Penicillins Shortness Of Breath   Tadalafil Shortness Of Breath   Aspartame And Phenylalanine Other (See Comments)    HEADACHES   Topiramate Other (See Comments)    Memory loss    Buspirone Other (See Comments)    Memory loss    Hydrocodone Other (See Comments)    Tingling all over     Current Outpatient Medications  Medication Sig Dispense Refill   aspirin EC 81 MG EC tablet Take 1 tablet (81 mg total) by mouth daily. Swallow whole. 90 tablet 2   azelastine (ASTELIN) 0.1 % nasal spray Place 2 sprays into both nostrils 2 (two) times daily. Use in  each nostril as directed 30 mL 12   clopidogrel (PLAVIX) 75 MG tablet Take 1 tablet by mouth once daily with breakfast 90 tablet 1   fluticasone (FLONASE) 50 MCG/ACT nasal spray Place 1 spray into both nostrils daily.     levocetirizine (XYZAL) 5 MG tablet Take 1 tablet (5 mg total) by mouth every evening. 30 tablet 11   magnesium 30 MG tablet Take 30 mg by mouth daily.     metoprolol succinate (TOPROL-XL) 50 MG 24 hr tablet Take 1 tablet (50 mg total) by mouth daily. Take with or immediately following a meal. 90 tablet 1   nitroGLYCERIN (NITROSTAT) 0.4 MG SL tablet Place 1 tablet (0.4 mg total) under the tongue every 5 (five) minutes as needed for chest pain. 25 tablet 2   pantoprazole (PROTONIX) 40 MG tablet Take 1 tablet (40 mg total) by mouth 2 (two) times daily. 180 tablet 3   rosuvastatin (CRESTOR) 40 MG tablet Take 1 tablet (40 mg total) by mouth daily. 90 tablet 3   traMADol (ULTRAM) 50 MG tablet TAKE 1 TABLET BY MOUTH EVERY 12 HOURS AS NEEDED FOR MODERATE PAIN OR SEVERE PAIN 20 tablet 0   traZODone (DESYREL) 50 MG tablet Take 1 tablet (50 mg total) by mouth at bedtime. [place]  tablet 0   No current facility-administered medications for this visit.     Past Surgical History:  Procedure Laterality Date   CARDIAC CATHETERIZATION     CORONARY STENT INTERVENTION N/A 05/19/2021   Procedure: CORONARY STENT INTERVENTION;  Surgeon: Belva Crome, MD;  Location: [place] [facility];  Service: Cardiovascular;  Laterality: N/A;   LEFT HEART CATH AND CORONARY ANGIOGRAPHY N/A 05/19/2021   Procedure: LEFT HEART CATH AND CORONARY ANGIOGRAPHY;  Surgeon: Belva Crome, MD;  Location: [place] [facility];  Service: Cardiovascular;  Laterality: N/A;     Allergies  Allergen Reactions   Albuterol Anaphylaxis   Azithromycin Tinitus    Hearing loss    Penicillins Shortness Of Breath   Tadalafil Shortness Of Breath   Aspartame And Phenylalanine Other (See Comments)    HEADACHES   Topiramate Other (See  Comments)    Memory loss    Buspirone Other (See Comments)    Memory loss    Hydrocodone Other (See Comments)    Tingling all over      Family History  Problem Relation Age of Onset   Sudden Cardiac Death Maternal Uncle      Social History [name] reports that he quit smoking about 3 years ago. His smoking use included cigarettes. He has never used smokeless tobacco. [name] reports that he does not currently use alcohol.   Review of Systems CONSTITUTIONAL: No weight loss, fever, chills, weakness or fatigue.  HEENT: Eyes: No visual loss, blurred vision, double vision or yellow sclerae.No hearing loss, sneezing, congestion, runny nose or sore throat.  SKIN: No rash or itching.  CARDIOVASCULAR: per hpi RESPIRATORY: No shortness of breath, cough or sputum.  GASTROINTESTINAL: No anorexia, nausea, vomiting or diarrhea. No abdominal pain or blood.  GENITOURINARY: No burning on urination, no polyuria NEUROLOGICAL: No headache, dizziness, syncope, paralysis, ataxia, numbness or tingling in the extremities. No change in bowel or bladder control.  MUSCULOSKELETAL: No muscle, back pain, joint pain or stiffness.  LYMPHATICS: No enlarged nodes. No history of splenectomy.  PSYCHIATRIC: No history of depression or anxiety.  ENDOCRINOLOGIC: No reports of sweating, cold or heat intolerance. No polyuria or polydipsia.  Marland Kitchen   Physical Examination Today's Vitals   05/20/22 1424  BP: 126/82  Pulse: 93  SpO2: 97%  Weight: 196 lb 9.6 oz (89.2 kg)  Height: 5' 10"$  (1.778 m)   Body mass index is 28.21 kg/m.  Gen: resting comfortably, no acute distress HEENT: no scleral icterus, pupils equal round and reactive, no palptable cervical adenopathy,  CV: RRR, no m/r/g no jvd Resp: Clear to auscultation bilaterally GI: abdomen is soft, non-tender, non-distended, normal bowel sounds, no hepatosplenomegaly MSK: extremities are warm, no edema.  Skin: warm, no rash Neuro:  no focal  deficits Psych: appropriate affect   Diagnostic Studies Abd U/S 05/06/21:   Summary:  Abdominal Aorta: There is evidence of abnormal dilatation of the mid  Abdominal aorta. The largest aortic diameter has increased compared to  prior exam. Previous diameter measurement was 3.8 cm obtained on 08/21/17  by CTA.      TTE 08/22/17:   Interpretation Summary  A complete portable two-dimensional transthoracic echocardiogram with color  flow Doppler and Spectral Doppler was performed. The left ventricle is  normal in size.  There is mild concentric left ventricular hypertrophy.  The left ventricular ejection fraction is normal (60-65%).  The left ventricular wall motion is normal.  Mild aortic sclerosis is present with good valvular opening.  There is mild aortic root dilatation.  The aortic valve is trileaflet.  Grade I mild diastolic dysfunction; abnormal relaxation pattern.  Estimation of right ventricular systolic pressure is not possible.      LHC 01/16/11:   CONCLUSIONS:  1.  Single vessel obstructive coronary artery disease in the setting of an  ST elevation myocardial infarction.  2.  Status post PCI to the proximal and mid RCA with a 3.0 x 28 and 3.5 x  12 mm vision bare-metal stents.  Lesion length was 30 mm, eccentric, mild  calcium, definite thrombus.  Lesion went from a 100% pre to 0% post  residual.  Pre-TIMI flow was 0, post-TIMI flow was 3.  3.  Right radial access.  4.  Preserved left ventricular function.    05/2021 cath Diffuse in-stent restenosis proximal to mid RCA with worse region up to 95%. Angioplasty/scoring balloon followed by restenting starting beyond the margin of the previously placed stent and extending back to the proximal segment reducing to 0% stenosis with TIMI grade III flow. Widely patent left main LAD beyond the newly placed stent contains eccentric 60% stenosis and tandem 30 to 40% stenoses in the distal segment. 50 to 60% mid circumflex. LV  function is normal.  LVEDP 11 mmHg.   RECOMMENDATIONS:   Dual antiplatelet therapy for 1 year then drop aspirin and continue clopidogrel indefinitely. Aggressive risk factor modification with LDL lowering to less than 55. Eligible for discharge in a.m. unless recurrent symptoms.     05/2021 echo  IMPRESSIONS     1. Left ventricular ejection fraction, by estimation, is 55 to 60%. The  left ventricle has normal function. The left ventricle has no regional  wall motion abnormalities. Left ventricular diastolic parameters were  normal.   2. Right ventricular systolic function is normal. The right ventricular  size is normal. There is normal pulmonary artery systolic pressure. The  estimated right ventricular systolic pressure is [id number] mmHg.   3. The mitral valve is grossly normal. No evidence of mitral valve  regurgitation. No evidence of mitral stenosis.   4. The aortic valve is tricuspid. Aortic valve regurgitation is trivial.  No aortic stenosis is present.   5. The inferior vena cava is normal in size with <50% respiratory  variability, suggesting right atrial pressure of 8 mmHg.       Assessment and Plan   1.CAD with stable angina - recent stenting as reported above - prior chest pains resovled on imdur - per interventional recs stop ASA, continue plavix going forward now that 1 year our from intervention - EKG shows SR, no ischemic changes   2. Hyperlipidemia - repeat lipid panel, we had changed him to crestor 72m daily after most recent panel - repeat lipid panel   3. HTN -at goal, continue current meds   JArnoldo Lenis M.D.

## 2022-05-27 ENCOUNTER — Ambulatory Visit: Payer: Medicare HMO | Admitting: Urology

## 2022-05-27 ENCOUNTER — Encounter: Payer: Self-pay | Admitting: Urology

## 2022-05-27 VITALS — BP 137/86 | HR 134 | Ht 70.0 in | Wt 199.0 lb

## 2022-05-27 DIAGNOSIS — N521 Erectile dysfunction due to diseases classified elsewhere: Secondary | ICD-10-CM

## 2022-05-27 DIAGNOSIS — I779 Disorder of arteries and arterioles, unspecified: Secondary | ICD-10-CM

## 2022-05-27 MED ORDER — TADALAFIL 20 MG PO TABS: ORAL_TABLET | ORAL | 0 refills | Status: DC

## 2022-05-27 MED ORDER — SILDENAFIL CITRATE 100 MG PO TABS: ORAL_TABLET | ORAL | 0 refills | Status: DC

## 2022-05-27 NOTE — Progress Notes (Signed)
Subjective: 1. 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 Ihor Dow  Jeremy Jenkins is a 72 yo male who is sent for ED.  He has a 2-3 year history of erectile dysfunction.  He is able to get a partial erection about 50% full that is insufficient for intercourse.  He can reach a climax with a partial erection.  He has no pain or curvature.  He has a good libido.  He has tried tadalafil '5mg'$  for daily use.  He had some SOB with that after a month but no erectile benefit.  He didn't respond to Viagra.   He has no voiding complaints or prior GU history.  He last had a cardiac stent place in February 2023.  He remains on plavix.    ROS:  Review of Systems  Respiratory:  Negative for shortness of breath.   Cardiovascular:  Positive for orthopnea. Negative for chest pain.  All other systems reviewed and are negative.   Allergies  Allergen Reactions   Albuterol Anaphylaxis   Azithromycin Tinitus    Hearing loss    Penicillins Shortness Of Breath   Tadalafil Shortness Of Breath   Aspartame And Phenylalanine Other (See Comments)    HEADACHES   Topiramate Other (See Comments)    Memory loss    Buspirone Other (See Comments)    Memory loss    Hydrocodone Other (See Comments)    Tingling all over    Past Medical History:  Diagnosis Date   NSTEMI (non-ST elevated myocardial infarction) ([place]) 05/19/2021   Old myocardial infarction 09/11/2013   Formatting of this note might be different from the original. Overview:  01/2011 Formatting of this note might be different from the original. 01/2011    Past Surgical History:  Procedure Laterality Date   CARDIAC CATHETERIZATION     CORONARY STENT INTERVENTION N/A 05/19/2021   Procedure: CORONARY STENT INTERVENTION;  Surgeon: Belva Crome, MD;  Location: [place] [facility];  Service: Cardiovascular;  Laterality: N/A;   LEFT HEART CATH AND CORONARY ANGIOGRAPHY N/A 05/19/2021   Procedure: LEFT HEART CATH AND CORONARY  ANGIOGRAPHY;  Surgeon: Belva Crome, MD;  Location: [place] [facility];  Service: Cardiovascular;  Laterality: N/A;    Social History   Socioeconomic History   Marital status: Single    Spouse name: Not on file   Number of children: Not on file   Years of education: Not on file   Highest education level: Not on file  Occupational History   Not on file  Tobacco Use   Smoking status: Former    Types: Cigarettes    Quit date: 06/02/2018    Years since quitting: 3.9   Smokeless tobacco: Never  Vaping Use   Vaping Use: Never used  Substance and Sexual Activity   Alcohol use: Not Currently   Drug use: Never   Sexual activity: Not on file  Other Topics Concern   Not on file  Social History Narrative   Not on file   Social Determinants of Health   Financial Resource Strain: Not on file  Food Insecurity: Not on file  Transportation Needs: Not on file  Physical Activity: Not on file  Stress: Not on file  Social Connections: Not on file  Intimate Partner Violence: Not on file    Family History  Problem Relation Age of Onset   Sudden Cardiac Death Maternal Uncle     Anti-infectives: Anti-infectives (From admission, onward)  None       Current Outpatient Medications  Medication Sig Dispense Refill   azelastine (ASTELIN) 0.1 % nasal spray Place 2 sprays into both nostrils 2 (two) times daily. Use in each nostril as directed 30 mL 12   clopidogrel (PLAVIX) 75 MG tablet Take 1 tablet by mouth once daily with breakfast 90 tablet 1   fluticasone (FLONASE) 50 MCG/ACT nasal spray Place 1 spray into both nostrils daily.     levocetirizine (XYZAL) 5 MG tablet Take 1 tablet (5 mg total) by mouth every evening. 30 tablet 11   magnesium 30 MG tablet Take 30 mg by mouth daily.     metoprolol succinate (TOPROL-XL) 50 MG 24 hr tablet Take 1 tablet (50 mg total) by mouth daily. Take with or immediately following a meal. 90 tablet 1   nitroGLYCERIN (NITROSTAT) 0.4 MG SL tablet  Place 1 tablet (0.4 mg total) under the tongue every 5 (five) minutes as needed for chest pain. 25 tablet 2   pantoprazole (PROTONIX) 40 MG tablet Take 1 tablet (40 mg total) by mouth 2 (two) times daily. 180 tablet 3   rosuvastatin (CRESTOR) 40 MG tablet Take 1 tablet (40 mg total) by mouth daily. 90 tablet 3   sildenafil (VIAGRA) 100 MG tablet Use 1/2 tab po with 1/2 tab of '20mg'$  tadalafil daily prn. 3 tablet 0   tadalafil (CIALIS) 20 MG tablet Take 1/2 tab po with 1/2 tab of '100mg'$  sildenafil po daily prn 3 tablet 0   traMADol (ULTRAM) 50 MG tablet TAKE 1 TABLET BY MOUTH EVERY 12 HOURS AS NEEDED FOR MODERATE PAIN OR SEVERE PAIN 20 tablet 0   traZODone (DESYREL) 50 MG tablet Take 1 tablet (50 mg total) by mouth at bedtime. 90 tablet 0   No current facility-administered medications for this visit.     Objective: Vital signs in last 24 hours: BP 137/86   Pulse (!) 134   Ht '5\' 10"'$  (1.778 m)   Wt 199 lb (90.3 kg)   BMI 28.55 kg/m   Intake/Output from previous day: No intake/output data recorded. Intake/Output this shift: '@IOTHISSHIFT'$ @   Physical Exam Vitals reviewed.  Constitutional:      Appearance: Normal appearance.  Cardiovascular:     Rate and Rhythm: Normal rate and regular rhythm.     Heart sounds: Normal heart sounds.  Pulmonary:     Effort: Pulmonary effort is normal.     Breath sounds: Normal breath sounds.  Abdominal:     Palpations: Abdomen is soft. There is no mass.     Tenderness: There is no abdominal tenderness.     Hernia: No hernia is present.  Genitourinary:    Comments: Normal phallus with adequate meatus. Scrotum, testes and epididymis normal.  Neurological:     Mental Status: He is alert.     Lab Results:  No results found for this or any previous visit (from the past 24 hour(s)).  BMET No results for input(s): "NA", "K", "CL", "CO2", "GLUCOSE", "BUN", "CREATININE", "CALCIUM" in the last 72 hours. PT/INR No results for input(s): "LABPROT", "INR"  in the last 72 hours. ABG No results for input(s): "PHART", "HCO3" in the last 72 hours.  Invalid input(s): "PCO2", "PO2"  Studies/Results: No results found.   Assessment/Plan: Vascular ED.  He has failed single agent meds.   I discussed combination sildenafil and tadalafil.   I discussed injections and MUSE and he is not interested.  I discussed the VED.  I discussed penile prostheses and  I discussed topical agents.    I will give him scripts for sildenafil and tadalafil to try in low dose combination.   Side effects and instructions reviewed.   Meds ordered this encounter  Medications   tadalafil (CIALIS) 20 MG tablet    Sig: Take 1/2 tab po with 1/2 tab of '100mg'$  sildenafil po daily prn    Dispense:  3 tablet    Refill:  0   sildenafil (VIAGRA) 100 MG tablet    Sig: Use 1/2 tab po with 1/2 tab of '20mg'$  tadalafil daily prn.    Dispense:  3 tablet    Refill:  0     No orders of the defined types were placed in this encounter.    Return in about 3 months (around 08/25/2022).    CC: Dr. Ihor Dow.      Irine Seal 05/28/2022

## 2022-06-08 ENCOUNTER — Other Ambulatory Visit: Payer: Self-pay

## 2022-06-08 DIAGNOSIS — I7143 Infrarenal abdominal aortic aneurysm, without rupture: Secondary | ICD-10-CM

## 2022-06-30 ENCOUNTER — Ambulatory Visit (INDEPENDENT_AMBULATORY_CARE_PROVIDER_SITE_OTHER): Payer: Medicare HMO

## 2022-06-30 ENCOUNTER — Encounter: Payer: Self-pay | Admitting: Vascular Surgery

## 2022-06-30 ENCOUNTER — Ambulatory Visit: Payer: Medicare HMO | Admitting: Vascular Surgery

## 2022-06-30 ENCOUNTER — Other Ambulatory Visit: Payer: Self-pay | Admitting: Internal Medicine

## 2022-06-30 VITALS — BP 131/88 | HR 85 | Temp 98.4°F | Ht 70.0 in | Wt 199.4 lb

## 2022-06-30 DIAGNOSIS — I714 Abdominal aortic aneurysm, without rupture, unspecified: Secondary | ICD-10-CM | POA: Diagnosis not present

## 2022-06-30 DIAGNOSIS — I7143 Infrarenal abdominal aortic aneurysm, without rupture: Secondary | ICD-10-CM | POA: Diagnosis not present

## 2022-06-30 DIAGNOSIS — J309 Allergic rhinitis, unspecified: Secondary | ICD-10-CM

## 2022-06-30 NOTE — Progress Notes (Signed)
[facility] [place]  Patient name: Jeremy Jenkins MRN: [id number] DOB: [birth year] Sex: male  REASON FOR VISIT: Follow-up known infrarenal abdominal aortic aneurysm  HPI: Jeremy Jenkins is a 72 y.o. male here today for follow-up.  He remains active and in good health.  No new major medical difficulties.  No symptoms referable to his aneurysm.  Past Medical History:  Diagnosis Date   NSTEMI (non-ST elevated myocardial infarction) ([place]) 05/19/2021   Old myocardial infarction 09/11/2013   Formatting of this note might be different from the original. Overview:  01/2011 Formatting of this note might be different from the original. 01/2011    Family History  Problem Relation Age of Onset   Sudden Cardiac Death Maternal Uncle     SOCIAL HISTORY: Social History   Tobacco Use   Smoking status: Former    Types: Cigarettes    Quit date: 06/02/2018    Years since quitting: 4.0   Smokeless tobacco: Never  Substance Use Topics   Alcohol use: Not Currently    Allergies  Allergen Reactions   Albuterol Anaphylaxis   Azithromycin Tinitus    Hearing loss    Penicillins Shortness Of Breath   Tadalafil Shortness Of Breath   Aspartame And Phenylalanine Other (See Comments)    HEADACHES   Topiramate Other (See Comments)    Memory loss    Buspirone Other (See Comments)    Memory loss    Hydrocodone Other (See Comments)    Tingling all over    Current Outpatient Medications  Medication Sig Dispense Refill   azelastine (ASTELIN) 0.1 % nasal spray Place 2 sprays into both nostrils 2 (two) times daily. Use in each nostril as directed 30 mL 12   clopidogrel (PLAVIX) 75 MG tablet Take 1 tablet by mouth once daily with breakfast 90 tablet 1   fluticasone (FLONASE) 50 MCG/ACT nasal spray Place 1 spray into both nostrils daily.     levocetirizine (XYZAL) 5 MG tablet Take 1 tablet (5 mg total) by mouth every evening. 30 tablet 11    magnesium 30 MG tablet Take 30 mg by mouth daily.     Melatonin 10 MG TABS Take by mouth.     metoprolol succinate (TOPROL-XL) 50 MG 24 hr tablet Take 1 tablet (50 mg total) by mouth daily. Take with or immediately following a meal. 90 tablet 1   Multiple Vitamin (QUINTABS) TABS Take 1 tablet by mouth daily.     nitroGLYCERIN (NITROSTAT) 0.4 MG SL tablet Place 1 tablet (0.4 mg total) under the tongue every 5 (five) minutes as needed for chest pain. 25 tablet 2   pantoprazole (PROTONIX) 40 MG tablet Take 1 tablet (40 mg total) by mouth 2 (two) times daily. 180 tablet 3   rosuvastatin (CRESTOR) 40 MG tablet Take 1 tablet (40 mg total) by mouth daily. 90 tablet 3   sildenafil (VIAGRA) 100 MG tablet Use 1/2 tab po with 1/2 tab of 20mg  tadalafil daily prn. 3 tablet 0   tadalafil (CIALIS) 20 MG tablet Take 1/2 tab po with 1/2 tab of 100mg  sildenafil po daily prn 3 tablet 0   traMADol (ULTRAM) 50 MG tablet TAKE 1 TABLET BY MOUTH EVERY 12 HOURS AS NEEDED FOR MODERATE PAIN OR SEVERE PAIN 20 tablet 0   traZODone (DESYREL) 50 MG tablet Take 1 tablet (50 mg total) by mouth at bedtime. 90 tablet 0   No current facility-administered medications for this visit.    REVIEW OF  SYSTEMS:  [X]  denotes positive finding, [ ]  denotes negative finding Cardiac  Comments:  Chest pain or chest pressure:    Shortness of breath upon exertion:    Short of breath when lying flat:    Irregular heart rhythm:        Vascular    Pain in calf, thigh, or hip brought on by ambulation:    Pain in feet at night that wakes you up from your sleep:     Blood clot in your veins:    Leg swelling:           PHYSICAL EXAM: Vitals:   06/30/22 1047  BP: 131/88  Pulse: 85  Temp: 98.4 F (36.9 C)  SpO2: 95%  Weight: 199 lb 6.4 oz (90.4 kg)  Height: 5\' 10"  (1.778 m)    GENERAL: The patient is a well-nourished male, in no acute distress. The vital signs are documented above. CARDIOVASCULAR: Plus radial and 2+ femoral  pulses bilaterally.  No evidence of popliteal artery aneurysms.  I do not feel an aneurysm and his abdominal exam PULMONARY: There is good air exchange  MUSCULOSKELETAL: There are no major deformities or cyanosis. NEUROLOGIC: No focal weakness or paresthesias are detected. SKIN: There are no ulcers or rashes noted. PSYCHIATRIC: The patient has a normal affect.  DATA:  Ultrasound today of his aorta was reviewed with the patient.  This shows maximal diameter 4.8 cm.  This peers to 4.5 cm 1 year ago.  MEDICAL ISSUES: Asymptomatic infrarenal abdominal aortic aneurysm with a slight growth since our last imaging study.  I again reviewed symptoms of leaking aneurysm and he knows to report immediate to the emergency room should this occur.  Otherwise we will see him in 6 months with repeat ultrasound imaging.  I explained that the indication for surgery would be rapid growth or aneurysm approaching 5.5 cm    Rosetta Posner, MD FACS [facility] [place] Office Tel [phone]  Note: Portions of this report may have been transcribed using voice recognition software.  Every effort has been made to ensure accuracy; however, inadvertent computerized transcription errors may still be present.

## 2022-07-23 ENCOUNTER — Other Ambulatory Visit: Payer: Self-pay | Admitting: Cardiology

## 2022-07-23 ENCOUNTER — Other Ambulatory Visit: Payer: Self-pay | Admitting: Internal Medicine

## 2022-07-23 DIAGNOSIS — J309 Allergic rhinitis, unspecified: Secondary | ICD-10-CM

## 2022-08-01 ENCOUNTER — Other Ambulatory Visit: Payer: Self-pay | Admitting: Internal Medicine

## 2022-08-01 DIAGNOSIS — J309 Allergic rhinitis, unspecified: Secondary | ICD-10-CM

## 2022-08-24 ENCOUNTER — Encounter: Payer: Self-pay | Admitting: Internal Medicine

## 2022-08-24 ENCOUNTER — Ambulatory Visit (INDEPENDENT_AMBULATORY_CARE_PROVIDER_SITE_OTHER): Payer: Medicare HMO | Admitting: Internal Medicine

## 2022-08-24 VITALS — BP 132/82 | HR 100 | Ht 70.0 in | Wt 199.0 lb

## 2022-08-24 DIAGNOSIS — K625 Hemorrhage of anus and rectum: Secondary | ICD-10-CM | POA: Diagnosis not present

## 2022-08-24 NOTE — Assessment & Plan Note (Addendum)
Patient had hematochezia for the last 3 days.Also having up to 5 liquid bowel movements per day which improved to 2 yesterday with use of imodium.  He had similar episode in June with excessive alcohol intake, and on DAPT.  Review of colonoscopy was 3 years ago showed moderate diverticuli in sigmoid colon and small internal hemorrhoids.  Patient has not had any melena, weight loss, hematemesis, or light headed feeling when standing.  He has not been drinking alcohol.  He continues DAPT because he had angina when stopping aspirin.   Patient deferred rectal exam today.    I expect mild bleeding and will recommend to continue antiplatelet therapy at this time. If feeling lightheaded patient will hold antihypertensive agents.  Check CBC and iron studies Given second episode will refer to GI to discuss colonoscopy.

## 2022-08-24 NOTE — Progress Notes (Signed)
   HPI:[name] is a 72 y.o. male who presents for evaluation of Blood In Stools (Patient states over three days he notices a consistent blood in stool) . For the details of today's visit, please refer to the assessment and plan.  Physical Exam: Vitals:   08/24/22 0955  BP: 132/82  Pulse: 100  SpO2: 92%  Weight: 199 lb (90.3 kg)  Height: 5\' 10"  (1.778 m)     Physical Exam Constitutional:      General: He is not in acute distress.    Appearance: He is not ill-appearing.  Cardiovascular:     Rate and Rhythm: Normal rate and regular rhythm.     Heart sounds: No murmur heard. Abdominal:     Palpations: There is no mass.     Tenderness: There is no abdominal tenderness. There is no guarding or rebound.      Assessment & Plan:   Milad was seen today for blood in stools.  Rectal bleeding Assessment & Plan: Patient had hematochezia for the last 3 days.Also having up to 5 liquid bowel movements per day which improved to 2 yesterday with use of imodium.  He had similar episode in June with excessive alcohol intake, and on DAPT.  Review of colonoscopy was 3 years ago showed moderate diverticuli in sigmoid colon and small internal hemorrhoids.  Patient has not had any melena, weight loss, hematemesis, or light headed feeling when standing.  He has not been drinking alcohol.  He continues DAPT because he had angina when stopping aspirin.   Patient deferred rectal exam today.    I expect mild bleeding and will recommend to continue antiplatelet therapy at this time. If feeling lightheaded patient will hold antihypertensive agents.  Check CBC and iron studies Given second episode will refer to GI to discuss colonoscopy.    Orders: -     CBC with Differential/Platelet -     Iron, TIBC and Ferritin Panel -     Ambulatory referral to Gastroenterology      Milus Banister, MD

## 2022-08-24 NOTE — Patient Instructions (Addendum)
Thank you, [name] for allowing [place] to provide your care today.   I have ordered the following labs for you:   Lab Orders         CBC with Differential         Fe+TIBC+Fer       Referrals ordered today:    Referral Orders         Ambulatory referral to Gastroenterology       I will follow up with results.     Thurmon Fair, M.D.

## 2022-08-25 ENCOUNTER — Other Ambulatory Visit: Payer: Self-pay | Admitting: Internal Medicine

## 2022-08-25 DIAGNOSIS — D500 Iron deficiency anemia secondary to blood loss (chronic): Secondary | ICD-10-CM

## 2022-08-25 LAB — IRON,TIBC AND FERRITIN PANEL
Ferritin: 29 ng/mL — ABNORMAL LOW (ref 30–400)
Iron Saturation: 29 % (ref 15–55)
Iron: 93 ug/dL (ref 38–169)
Total Iron Binding Capacity: 320 ug/dL (ref 250–450)
UIBC: 227 ug/dL (ref 111–343)

## 2022-08-25 LAB — CBC WITH DIFFERENTIAL/PLATELET
Basophils Absolute: 0.1 10*3/uL (ref 0.0–0.2)
Basos: 1 %
EOS (ABSOLUTE): 0.3 10*3/uL (ref 0.0–0.4)
Eos: 3 %
Hematocrit: 38.9 % (ref 37.5–51.0)
Hemoglobin: 12.6 g/dL — ABNORMAL LOW (ref 13.0–17.7)
Immature Grans (Abs): 0.1 10*3/uL (ref 0.0–0.1)
Immature Granulocytes: 1 %
Lymphocytes Absolute: 2.1 10*3/uL (ref 0.7–3.1)
Lymphs: 20 %
MCH: 28.7 pg (ref 26.6–33.0)
MCHC: 32.4 g/dL (ref 31.5–35.7)
MCV: 89 fL (ref 79–97)
Monocytes Absolute: 0.8 10*3/uL (ref 0.1–0.9)
Monocytes: 7 %
Neutrophils Absolute: 7.3 10*3/uL — ABNORMAL HIGH (ref 1.4–7.0)
Neutrophils: 68 %
Platelets: 188 10*3/uL (ref 150–450)
RBC: 4.39 x10E6/uL (ref 4.14–5.80)
RDW: 16.1 % — ABNORMAL HIGH (ref 11.6–15.4)
WBC: 10.8 10*3/uL (ref 3.4–10.8)

## 2022-08-25 MED ORDER — POLYSACCHARIDE IRON COMPLEX 150 MG PO CAPS: 150.0000 mg | ORAL_CAPSULE | Freq: Every day | ORAL | 1 refills | Status: DC

## 2022-08-26 ENCOUNTER — Ambulatory Visit: Payer: Medicare HMO | Admitting: Urology

## 2022-08-27 ENCOUNTER — Other Ambulatory Visit (HOSPITAL_COMMUNITY)
Admission: RE | Admit: 2022-08-27 | Discharge: 2022-08-27 | Disposition: A | Discharge status: Home / Self Care | Payer: Medicare HMO | Source: Ambulatory Visit | Attending: Gastroenterology | Admitting: Gastroenterology

## 2022-08-27 ENCOUNTER — Ambulatory Visit: Payer: Medicare HMO | Admitting: Gastroenterology

## 2022-08-27 ENCOUNTER — Encounter: Payer: Self-pay | Admitting: Gastroenterology

## 2022-08-27 ENCOUNTER — Inpatient Hospital Stay (HOSPITAL_COMMUNITY)
Admission: EM | Admit: 2022-08-27 | Discharge: 2022-08-29 | DRG: 378 | Disposition: A | Discharge status: Home / Self Care | Payer: Medicare HMO | Attending: Family Medicine | Admitting: Family Medicine

## 2022-08-27 ENCOUNTER — Other Ambulatory Visit: Payer: Self-pay

## 2022-08-27 ENCOUNTER — Encounter (HOSPITAL_COMMUNITY): Payer: Self-pay | Admitting: Emergency Medicine

## 2022-08-27 VITALS — BP 112/74 | HR 67 | Temp 98.0°F | Ht 70.0 in | Wt 199.0 lb

## 2022-08-27 DIAGNOSIS — K219 Gastro-esophageal reflux disease without esophagitis: Secondary | ICD-10-CM | POA: Diagnosis not present

## 2022-08-27 DIAGNOSIS — D649 Anemia, unspecified: Secondary | ICD-10-CM | POA: Diagnosis not present

## 2022-08-27 DIAGNOSIS — I25110 Atherosclerotic heart disease of native coronary artery with unstable angina pectoris: Secondary | ICD-10-CM

## 2022-08-27 DIAGNOSIS — Z885 Allergy status to narcotic agent status: Secondary | ICD-10-CM | POA: Diagnosis not present

## 2022-08-27 DIAGNOSIS — I2510 Atherosclerotic heart disease of native coronary artery without angina pectoris: Secondary | ICD-10-CM | POA: Diagnosis present

## 2022-08-27 DIAGNOSIS — Z79899 Other long term (current) drug therapy: Secondary | ICD-10-CM | POA: Diagnosis not present

## 2022-08-27 DIAGNOSIS — I714 Abdominal aortic aneurysm, without rupture, unspecified: Secondary | ICD-10-CM | POA: Diagnosis present

## 2022-08-27 DIAGNOSIS — K922 Gastrointestinal hemorrhage, unspecified: Secondary | ICD-10-CM | POA: Diagnosis not present

## 2022-08-27 DIAGNOSIS — Z888 Allergy status to other drugs, medicaments and biological substances status: Secondary | ICD-10-CM | POA: Diagnosis not present

## 2022-08-27 DIAGNOSIS — E785 Hyperlipidemia, unspecified: Secondary | ICD-10-CM | POA: Diagnosis not present

## 2022-08-27 DIAGNOSIS — K5790 Diverticulosis of intestine, part unspecified, without perforation or abscess without bleeding: Secondary | ICD-10-CM | POA: Diagnosis not present

## 2022-08-27 DIAGNOSIS — Z880 Allergy status to penicillin: Secondary | ICD-10-CM

## 2022-08-27 DIAGNOSIS — Z7982 Long term (current) use of aspirin: Secondary | ICD-10-CM

## 2022-08-27 DIAGNOSIS — K648 Other hemorrhoids: Secondary | ICD-10-CM | POA: Diagnosis present

## 2022-08-27 DIAGNOSIS — K449 Diaphragmatic hernia without obstruction or gangrene: Secondary | ICD-10-CM | POA: Diagnosis not present

## 2022-08-27 DIAGNOSIS — Z87891 Personal history of nicotine dependence: Secondary | ICD-10-CM

## 2022-08-27 DIAGNOSIS — Z955 Presence of coronary angioplasty implant and graft: Secondary | ICD-10-CM

## 2022-08-27 DIAGNOSIS — Z7902 Long term (current) use of antithrombotics/antiplatelets: Secondary | ICD-10-CM | POA: Diagnosis not present

## 2022-08-27 DIAGNOSIS — K625 Hemorrhage of anus and rectum: Secondary | ICD-10-CM

## 2022-08-27 DIAGNOSIS — I10 Essential (primary) hypertension: Secondary | ICD-10-CM | POA: Diagnosis not present

## 2022-08-27 DIAGNOSIS — K5730 Diverticulosis of large intestine without perforation or abscess without bleeding: Secondary | ICD-10-CM | POA: Diagnosis present

## 2022-08-27 DIAGNOSIS — K921 Melena: Secondary | ICD-10-CM | POA: Diagnosis not present

## 2022-08-27 DIAGNOSIS — I252 Old myocardial infarction: Secondary | ICD-10-CM

## 2022-08-27 DIAGNOSIS — D62 Acute posthemorrhagic anemia: Secondary | ICD-10-CM | POA: Diagnosis present

## 2022-08-27 LAB — CBC WITH DIFFERENTIAL/PLATELET
Abs Immature Granulocytes: 0.12 10*3/uL — ABNORMAL HIGH (ref 0.00–0.07)
Abs Immature Granulocytes: 0.09 10*3/uL — ABNORMAL HIGH (ref 0.00–0.07)
Basophils Absolute: 0.1 10*3/uL (ref 0.0–0.1)
Basophils Absolute: 0.1 10*3/uL (ref 0.0–0.1)
Basophils Relative: 1 %
Basophils Relative: 1 %
Eosinophils Absolute: 0.3 10*3/uL (ref 0.0–0.5)
Eosinophils Absolute: 0.3 10*3/uL (ref 0.0–0.5)
Eosinophils Relative: 2 %
Eosinophils Relative: 2 %
HCT: 27.6 % — ABNORMAL LOW (ref 39.0–52.0)
HCT: 27.2 % — ABNORMAL LOW (ref 39.0–52.0)
Hemoglobin: 8.9 g/dL — ABNORMAL LOW (ref 13.0–17.0)
Hemoglobin: 8.7 g/dL — ABNORMAL LOW (ref 13.0–17.0)
Immature Granulocytes: 1 %
Immature Granulocytes: 1 %
Lymphocytes Relative: 19 %
Lymphocytes Relative: 24 %
Lymphs Abs: 2.3 10*3/uL (ref 0.7–4.0)
Lymphs Abs: 2.9 10*3/uL (ref 0.7–4.0)
MCH: 29.1 pg (ref 26.0–34.0)
MCH: 29.1 pg (ref 26.0–34.0)
MCHC: 32.2 g/dL (ref 30.0–36.0)
MCHC: 32 g/dL (ref 30.0–36.0)
MCV: 90.2 fL (ref 80.0–100.0)
MCV: 91 fL (ref 80.0–100.0)
Monocytes Absolute: 0.9 10*3/uL (ref 0.1–1.0)
Monocytes Absolute: 0.9 10*3/uL (ref 0.1–1.0)
Monocytes Relative: 8 %
Monocytes Relative: 8 %
Neutro Abs: 8.8 10*3/uL — ABNORMAL HIGH (ref 1.7–7.7)
Neutro Abs: 7.7 10*3/uL (ref 1.7–7.7)
Neutrophils Relative %: 69 %
Neutrophils Relative %: 64 %
Platelets: 202 10*3/uL (ref 150–400)
Platelets: 204 10*3/uL (ref 150–400)
RBC: 3.06 MIL/uL — ABNORMAL LOW (ref 4.22–5.81)
RBC: 2.99 MIL/uL — ABNORMAL LOW (ref 4.22–5.81)
RDW: 16.2 % — ABNORMAL HIGH (ref 11.5–15.5)
RDW: 16.4 % — ABNORMAL HIGH (ref 11.5–15.5)
WBC: 12.5 10*3/uL — ABNORMAL HIGH (ref 4.0–10.5)
WBC: 11.9 10*3/uL — ABNORMAL HIGH (ref 4.0–10.5)
nRBC: 0 % (ref 0.0–0.2)
nRBC: 0 % (ref 0.0–0.2)

## 2022-08-27 LAB — COMPREHENSIVE METABOLIC PANEL
ALT: 9 U/L (ref 0–44)
AST: 16 U/L (ref 15–41)
Albumin: 3.5 g/dL (ref 3.5–5.0)
Alkaline Phosphatase: 48 U/L (ref 38–126)
Anion gap: 9 (ref 5–15)
BUN: 19 mg/dL (ref 8–23)
CO2: 25 mmol/L (ref 22–32)
Calcium: 8.4 mg/dL — ABNORMAL LOW (ref 8.9–10.3)
Chloride: 101 mmol/L (ref 98–111)
Creatinine, Ser: 1.18 mg/dL (ref 0.61–1.24)
GFR, Estimated: >60 mL/min (ref >60)
Glucose, Bld: 152 mg/dL — ABNORMAL HIGH (ref 70–99)
Potassium: 4.1 mmol/L (ref 3.5–5.1)
Sodium: 135 mmol/L (ref 135–145)
Total Bilirubin: 0.7 mg/dL (ref 0.3–1.2)
Total Protein: 6.5 g/dL (ref 6.5–8.1)

## 2022-08-27 LAB — POC OCCULT BLOOD, ED: Fecal Occult Bld: POSITIVE — AB

## 2022-08-27 LAB — PREPARE RBC (CROSSMATCH)

## 2022-08-27 LAB — PROTIME-INR
INR: 1.1 (ref 0.8–1.2)
Prothrombin Time: 14.4 seconds (ref 11.4–15.2)

## 2022-08-27 MED ORDER — ADULT MULTIVITAMIN W/MINERALS CH
1.0000 | ORAL_TABLET | Freq: Every day | ORAL | Status: DC
  Administered 2022-08-27 – 2022-08-29 (×3): 1 via ORAL
  Filled 2022-08-27 (×3): qty 1

## 2022-08-27 MED ORDER — NITROGLYCERIN 0.4 MG SL SUBL: 0.4000 mg | SUBLINGUAL_TABLET | SUBLINGUAL | Status: DC | PRN

## 2022-08-27 MED ORDER — HYDRALAZINE HCL 20 MG/ML IJ SOLN: 5.0000 mg | INTRAMUSCULAR | Status: DC | PRN

## 2022-08-27 MED ORDER — FLUTICASONE PROPIONATE 50 MCG/ACT NA SUSP
1.0000 | Freq: Every day | NASAL | Status: DC
  Administered 2022-08-28 – 2022-08-29 (×2): 1 via NASAL
  Filled 2022-08-27 (×2): qty 16

## 2022-08-27 MED ORDER — SODIUM CHLORIDE 0.9% IV SOLUTION: Freq: Once | INTRAVENOUS | Status: DC

## 2022-08-27 MED ORDER — LORATADINE 10 MG PO TABS
10.0000 mg | ORAL_TABLET | Freq: Every day | ORAL | Status: DC
  Administered 2022-08-27 – 2022-08-29 (×3): 10 mg via ORAL
  Filled 2022-08-27 (×3): qty 1

## 2022-08-27 MED ORDER — TRAZODONE HCL 50 MG PO TABS
50.0000 mg | ORAL_TABLET | Freq: Every day | ORAL | Status: DC
  Administered 2022-08-27 – 2022-08-28 (×2): 50 mg via ORAL
  Filled 2022-08-27 (×2): qty 1

## 2022-08-27 MED ORDER — PANTOPRAZOLE 80MG IVPB - SIMPLE MED
80.0000 mg | Freq: Once | INTRAVENOUS | Status: AC
  Administered 2022-08-27: 80 mg via INTRAVENOUS
  Filled 2022-08-27: qty 100

## 2022-08-27 MED ORDER — LOPERAMIDE HCL 2 MG PO CAPS: 2.0000 mg | ORAL_CAPSULE | ORAL | Status: DC | PRN

## 2022-08-27 MED ORDER — OMEGA-3-ACID ETHYL ESTERS 1 G PO CAPS
1.0000 g | ORAL_CAPSULE | Freq: Two times a day (BID) | ORAL | Status: DC
  Administered 2022-08-27 – 2022-08-29 (×4): 1 g via ORAL
  Filled 2022-08-27 (×4): qty 1

## 2022-08-27 MED ORDER — ROSUVASTATIN CALCIUM 20 MG PO TABS
40.0000 mg | ORAL_TABLET | Freq: Every day | ORAL | Status: DC
  Administered 2022-08-28 – 2022-08-29 (×2): 40 mg via ORAL
  Filled 2022-08-27 (×2): qty 2

## 2022-08-27 MED ORDER — MELATONIN 5 MG PO TABS
10.0000 mg | ORAL_TABLET | Freq: Every day | ORAL | Status: DC
  Filled 2022-08-27 (×3): qty 2

## 2022-08-27 MED ORDER — PANTOPRAZOLE SODIUM 40 MG IV SOLR: 40.0000 mg | Freq: Two times a day (BID) | INTRAVENOUS | Status: DC

## 2022-08-27 MED ORDER — LEVOCETIRIZINE DIHYDROCHLORIDE 5 MG PO TABS: 5.0000 mg | ORAL_TABLET | Freq: Every evening | ORAL | Status: DC

## 2022-08-27 MED ORDER — OXYCODONE HCL 5 MG PO TABS: 5.0000 mg | ORAL_TABLET | ORAL | Status: DC | PRN

## 2022-08-27 MED ORDER — METOPROLOL SUCCINATE ER 50 MG PO TB24
50.0000 mg | ORAL_TABLET | Freq: Every day | ORAL | Status: DC
  Administered 2022-08-28 – 2022-08-29 (×2): 50 mg via ORAL
  Filled 2022-08-27 (×2): qty 1

## 2022-08-27 MED ORDER — ACETAMINOPHEN 325 MG PO TABS: 650.0000 mg | ORAL_TABLET | Freq: Four times a day (QID) | ORAL | Status: DC | PRN

## 2022-08-27 MED ORDER — ACETAMINOPHEN 650 MG RE SUPP: 650.0000 mg | Freq: Four times a day (QID) | RECTAL | Status: DC | PRN

## 2022-08-27 NOTE — ED Triage Notes (Addendum)
Pt via POV, sent by GI and PCP due to abnormal labs. HgB 8.9, down from 11.1 on Tuesday. Pt has an active GI bleed currently in treatment and GI is aware. Labs were drawn here. Pt admits to feeling light headed and somewhat dizzy.

## 2022-08-27 NOTE — ED Notes (Signed)
ED TO INPATIENT HANDOFF REPORT  ED Nurse Name and Phone #: Jeanella Anton [phone]  S Name/Age/Gender Jeremy Jenkins 72 y.o. male Room/Bed: APA18/APA18  Code Status   Code Status: Prior  Home/SNF/Other Home Patient oriented to: self, place, time, and situation Is this baseline? Yes   Triage Complete: Triage complete  Chief Complaint GI bleed [K92.2]  Triage Note Pt via POV, sent by GI and PCP due to abnormal labs. HgB 8.9, down from 11.1 on Tuesday. Pt has an active GI bleed currently in treatment and GI is aware. Labs were drawn here. Pt admits to feeling light headed and somewhat dizzy.    Allergies Allergies  Allergen Reactions   Albuterol Anaphylaxis   Azithromycin Tinitus    Hearing loss    Penicillins Shortness Of Breath   Tadalafil Shortness Of Breath   Aspartame And Phenylalanine Other (See Comments)    HEADACHES   Topiramate Other (See Comments)    Memory loss    Buspirone Other (See Comments)    Memory loss    Hydrocodone Other (See Comments)    Tingling all over    Level of Care/Admitting Diagnosis ED Disposition     ED Disposition  Admit   Condition  --   Comment  Hospital Area: [facility] [[id number]]  Level of Care: Telemetry [5]  Covid Evaluation: Asymptomatic - no recent exposure (last 10 days) testing not required  Diagnosis: GI bleed [086578]  Admitting Physician: Chiquita Loth  Attending Physician: Randol Kern, DAWOOD S [[id number]]  Certification:: I certify this patient will need inpatient services for at least 2 midnights  Estimated Length of Stay: 2          B Medical/Surgery History Past Medical History:  Diagnosis Date   AAA (abdominal aortic aneurysm) ([facility])    NSTEMI (non-ST elevated myocardial infarction) ([facility]) 05/19/2021   Old myocardial infarction 09/11/2013   Formatting of this note might be different from the original. Overview:  01/2011 Formatting of this note might be different from the original. 01/2011    Past Surgical History:  Procedure Laterality Date   CARDIAC CATHETERIZATION     CORONARY STENT INTERVENTION N/A 05/19/2021   Procedure: CORONARY STENT INTERVENTION;  Surgeon: Lyn Records, MD;  [facility] INVAS[facility];  Service: Cardiovascular;  Laterality: N/A;   LEFT HEART CATH AND CORONARY ANGIOGRAPHY N/A 05/19/2021   Procedure: LEFT HEART CATH AND CORONARY ANGIOGRAPHY;  Surgeon: Lyn Records, MD;  Location: [facility] INVASIVE [facility] LAB;  Service: Cardiovascular;  Laterality: N/A;     A IV Location/Drains/Wounds Patient Lines/Drains/Airways Status     Active Line/Drains/Airways     Name Placement date Placement time Site Days   Peripheral IV 08/27/22 Left Antecubital 08/27/22  1920  Antecubital  less than 1            Intake/Output Last 24 hours  Intake/Output Summary (Last 24 hours) at 08/27/2022 1950 Last data filed at 08/27/2022 1949 Gross per 24 hour  Intake 100 ml  Output --  Net 100 ml    Labs/Imaging Results for orders placed or performed during the hospital encounter of 08/27/22 (from the past 48 hour(s))  Type and screen [facility]     Status: None   Collection Time: 08/27/22  3:05 PM  Result Value Ref Range   ABO/RH(D) A POS    Antibody Screen NEG    Sample Expiration      08/30/2022,2359 Performed at [facility], [address] [place], [place]  [address]   Comprehensive metabolic panel     Status: Abnormal   Collection Time: 08/27/22  3:08 PM  Result Value Ref Range   Sodium 135 135 - 145 mmol/L   Potassium 4.1 3.5 - 5.1 mmol/L   Chloride 101 98 - 111 mmol/L   CO2 25 22 - 32 mmol/L   Glucose, Bld 152 (H) 70 - 99 mg/dL    Comment: Glucose reference range applies only to samples taken after fasting for at least 8 hours.   BUN 19 8 - 23 mg/dL   Creatinine, Ser 8.11 0.61 - 1.24 mg/dL   Calcium 8.4 (L) 8.9 - 10.3 mg/dL   Total Protein 6.5 6.5 - 8.1 g/dL   Albumin 3.5 3.5 - 5.0 g/dL   AST 16 15 - 41 U/L   ALT 9 0 - 44 U/L   Alkaline  Phosphatase 48 38 - 126 U/L   Total Bilirubin 0.7 0.3 - 1.2 mg/dL   GFR, Estimated >91 >47 mL/min    Comment: (NOTE) Calculated using the CKD-EPI Creatinine Equation (2021)    Anion gap 9 5 - 15    Comment: Performed at [facility], [address], [place], [place] [address]  CBC with Differential     Status: Abnormal   Collection Time: 08/27/22  3:08 PM  Result Value Ref Range   WBC 11.9 (H) 4.0 - 10.5 K/uL   RBC 2.99 (L) 4.22 - 5.81 MIL/uL   Hemoglobin 8.7 (L) 13.0 - 17.0 g/dL   HCT 21.3 (L) 08.6 - 57.8 %   MCV 91.0 80.0 - 100.0 fL   MCH 29.1 26.0 - 34.0 pg   MCHC 32.0 30.0 - 36.0 g/dL   RDW 46.9 (H) 62.9 - 52.8 %   Platelets 204 150 - 400 K/uL   nRBC 0.0 0.0 - 0.2 %   Neutrophils Relative % 64 %   Neutro Abs 7.7 1.7 - 7.7 K/uL   Lymphocytes Relative 24 %   Lymphs Abs 2.9 0.7 - 4.0 K/uL   Monocytes Relative 8 %   Monocytes Absolute 0.9 0.1 - 1.0 K/uL   Eosinophils Relative 2 %   Eosinophils Absolute 0.3 0.0 - 0.5 K/uL   Basophils Relative 1 %   Basophils Absolute 0.1 0.0 - 0.1 K/uL   Immature Granulocytes 1 %   Abs Immature Granulocytes 0.09 (H) 0.00 - 0.07 K/uL    Comment: Performed at [facility], [address], [place], [place] [address]  Protime-INR     Status: None   Collection Time: 08/27/22  3:08 PM  Result Value Ref Range   Prothrombin Time 14.4 11.4 - 15.2 seconds   INR 1.1 0.8 - 1.2    Comment: (NOTE) INR goal varies based on device and disease states. Performed at [facility], [address], [place], [place] [address]   POC occult blood, ED     Status: Abnormal   Collection Time: 08/27/22  5:53 PM  Result Value Ref Range   Fecal Occult Bld POSITIVE (A) NEGATIVE   No results found.  Pending Labs Unresulted Labs (From admission, onward)    None       Vitals/Pain Today's Vitals   08/27/22 1411 08/27/22 1413 08/27/22 1705 08/27/22 1715  BP: 113/79  (!) 142/80 126/73  Pulse: 94  80 77  Resp: 18  15 16   Temp: 98.7 F (37.1 C)     TempSrc:  Oral     SpO2: 97%  100% 99%  Weight:  90.3 kg  Height:  5\' 10"  (1.778 m)    PainSc:  0-No pain      Isolation Precautions No active isolations  Medications Medications  pantoprazole (PROTONIX) injection 40 mg (has no administration in time range)  pantoprazole (PROTONIX) 80 mg /NS 100 mL IVPB (0 mg Intravenous Stopped 08/27/22 1949)    Mobility walks     Focused Assessments     R Recommendations: See Admitting Provider Note  Report given to:   Additional Notes:

## 2022-08-27 NOTE — Patient Instructions (Signed)
Please go to [facility] now for blood work. Stop Plavix in preparation for procedure.  Tomorrow start aspirin 81 mg daily.  Continue pantoprazole.

## 2022-08-27 NOTE — H&P (Signed)
TRH H&P   Patient Demographics:    Jeremy Jenkins, is a 72 y.o. male  MRN: [id number]   DOB - 01-01-1951  Admit Date - 08/27/2022  Outpatient Primary MD for the patient is Anabel Halon, MD  Referring MD/NP/PA: dr Eloise Harman  Outpatient Specialists: GI Dr carver  Patient coming from: home  Chief Complaint  Patient presents with   abnormal labs      HPI:    Jeremy Jenkins  is a 72 y.o. male, with past medical history of AAA, CAD status post PCI in 2013 and 2023, diverticulosis, hypertension, hyperlipidemia, patient presents to ED secondary to home plaints of dark stools/melena, patient was seen by GI earlier today, describes dark blood per rectum, occurring 4-5 times a day, intermittent, no nausea, no vomiting, has any NSAIDs use, reports using Plavix most recent today due to history of stents, reports his hemoglobin has dropped to 8.9 during GI visit today which brought with him to come to ED, he denies any chest pain, shortness of breath, no abdominal pain. -In ED his workup significant for stable creatinine at 1.1, hemoglobin dropped at 8.7, was 12.6 only 3 days ago , platelets stable at 204, Hemoccult positive,[facility] hospitalist consulted to admit    Review of systems:      A full 10 point Review of Systems was done, except as stated above, all other Review of Systems were negative.   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 Overview:  01/2011 Formatting of this note might be different from the original. 01/2011      Past Surgical History:  Procedure Laterality Date   CARDIAC CATHETERIZATION     CORONARY STENT  INTERVENTION N/A 05/19/2021   Procedure: CORONARY STENT INTERVENTION;  Surgeon: Lyn Records, MD;  Location: [facility] INVASIVE CV LAB;  Service: Cardiovascular;  Laterality: N/A;   LEFT HEART CATH AND CORONARY ANGIOGRAPHY N/A 05/19/2021   Procedure: LEFT HEART CATH AND CORONARY ANGIOGRAPHY;  Surgeon: Lyn Records, MD;  Location: [facility] INVASIVE CV LAB;  Service: Cardiovascular;  Laterality: N/A;      Social History:     Social History   Tobacco Use   Smoking status: Former    Types: Cigarettes    Quit date: 06/02/2018    Years since quitting: 4.2   Smokeless tobacco: Never  Substance Use Topics   Alcohol use: Not Currently        Family History :     Family History  Problem Relation Age of Onset   Sudden Cardiac Death Maternal Uncle      Home Medications:   Prior to Admission medications   Medication  Sig Start Date End Date Taking? Authorizing Provider  Azelastine HCl 137 MCG/SPRAY SOLN USE 2 SPRAY(S) IN EACH NOSTRIL TWICE DAILY AS DIRECTED Patient taking differently: Inhale 2 sprays into the lungs daily. 07/23/22  Yes Anabel Halon, MD  clopidogrel (PLAVIX) 75 MG tablet Take 1 tablet by mouth once daily with breakfast 07/23/22  Yes Branch, Dorothe Pea, MD  Coenzyme Q10 (CO Q 10 PO) Take by mouth.   Yes [provider]  fluticasone (FLONASE) 50 MCG/ACT nasal spray Place 1 spray into both nostrils daily. 04/03/21  Yes [provider]  levocetirizine (XYZAL) 5 MG tablet TAKE 1 TABLET BY MOUTH ONCE DAILY IN THE EVENING 08/02/22  Yes Anabel Halon, MD  loperamide (IMODIUM) 2 MG capsule Take 2 mg by mouth as needed for diarrhea or loose stools.   Yes [provider]  Melatonin 10 MG TABS Take 10 mg by mouth at bedtime. 05/02/19  Yes [provider]  metoprolol succinate (TOPROL-XL) 50 MG 24 hr tablet Take 1 tablet (50 mg total) by mouth daily. Take with or immediately following a meal. 03/23/22  Yes Anabel Halon, MD  Multiple Vitamin (QUINTABS)  TABS Take 1 tablet by mouth daily. 05/02/19  Yes [provider]  nitroGLYCERIN (NITROSTAT) 0.4 MG SL tablet Place 1 tablet (0.4 mg total) under the tongue every 5 (five) minutes as needed for chest pain. 05/20/21  Yes Arty Baumgartner, NP  Omega-3 Fatty Acids (FISH OIL) 1000 MG CPDR Take 1 tablet by mouth 2 (two) times daily.   Yes [provider]  pantoprazole (PROTONIX) 40 MG tablet Take 1 tablet (40 mg total) by mouth 2 (two) times daily. 12/11/21  Yes Branch, Dorothe Pea, MD  rosuvastatin (CRESTOR) 40 MG tablet Take 1 tablet (40 mg total) by mouth daily. 12/29/21 12/24/22 Yes BranchDorothe Pea, MD  traZODone (DESYREL) 50 MG tablet Take 1 tablet (50 mg total) by mouth at bedtime. 03/23/22  Yes Anabel Halon, MD  Vitamin D-Vitamin K (VITAMIN K2-VITAMIN D3 PO) Take by mouth.   Yes [provider]  iron polysaccharides (NU-IRON) 150 MG capsule Take 1 capsule (150 mg total) by mouth daily. Patient not taking: Reported on 08/27/2022 08/25/22   Gardenia Phlegm, MD     Allergies:     Allergies  Allergen Reactions   Albuterol Anaphylaxis   Azithromycin Tinitus    Hearing loss    Penicillins Shortness Of Breath   Tadalafil Shortness Of Breath   Aspartame And Phenylalanine Other (See Comments)    HEADACHES   Topiramate Other (See Comments)    Memory loss    Buspirone Other (See Comments)    Memory loss    Hydrocodone Other (See Comments)    Tingling all over     Physical Exam:   Vitals  Blood pressure 124/72, pulse 81, temperature 98.3 F (36.8 C), resp. rate 18, height 5\' 10"  (1.778 m), weight 90.3 kg, SpO2 97 %.   1. General well developed male  lying in bed in NAD,    2. Normal affect and insight, Not Suicidal or Homicidal, Awake Alert, Oriented X 3.  3. No F.N deficits, ALL C.Nerves Intact, Strength 5/5 all 4 extremities, Sensation intact all 4 extremities, Plantars down going.  4. Ears and Eyes appear Normal, Conjunctivae clear, PERRLA. Moist Oral  Mucosa.  5. Supple Neck, No JVD, No cervical lymphadenopathy appriciated, No Carotid Bruits.  6. Symmetrical Chest wall movement, Good air movement bilaterally, CTAB.  7. RRR, No  Gallops, Rubs or Murmurs, No Parasternal Heave.  8. Positive Bowel Sounds, Abdomen Soft, No tenderness, No organomegaly appriciated,No rebound -guarding or rigidity.  9.  No Cyanosis, Normal Skin Turgor, No Skin Rash or Bruise.  10. Good muscle tone,  joints appear normal , no effusions, Normal ROM.     Data Review:    CBC Recent Labs  Lab 08/24/22 1044 08/27/22 1141 08/27/22 1508  WBC 10.8 12.5* 11.9*  HGB 12.6* 8.9* 8.7*  HCT 38.9 27.6* 27.2*  PLT 188 202 204  MCV 89 90.2 91.0  MCH 28.7 29.1 29.1  MCHC 32.4 32.2 32.0  RDW 16.1* 16.2* 16.4*  LYMPHSABS 2.1 2.3 2.9  MONOABS  --  0.9 0.9  EOSABS 0.3 0.3 0.3  BASOSABS 0.1 0.1 0.1   ------------------------------------------------------------------------------------------------------------------  Chemistries  Recent Labs  Lab 08/27/22 1508  NA 135  K 4.1  CL 101  CO2 25  GLUCOSE 152*  BUN 19  CREATININE 1.18  CALCIUM 8.4*  AST 16  ALT 9  ALKPHOS 48  BILITOT 0.7   ------------------------------------------------------------------------------------------------------------------ estimated creatinine clearance is 64 mL/min (by C-G formula based on SCr of 1.18 mg/dL). ------------------------------------------------------------------------------------------------------------------ No results for input(s): "TSH", "T4TOTAL", "T3FREE", "THYROIDAB" in the last 72 hours.  Invalid input(s): "FREET3"  Coagulation profile Recent Labs  Lab 08/27/22 1508  INR 1.1   ------------------------------------------------------------------------------------------------------------------- No results for input(s): "DDIMER" in the last 72  hours. -------------------------------------------------------------------------------------------------------------------  Cardiac Enzymes No results for input(s): "CKMB", "TROPONINI", "MYOGLOBIN" in the last 168 hours.  Invalid input(s): "CK" ------------------------------------------------------------------------------------------------------------------ No results found for: "BNP"   ---------------------------------------------------------------------------------------------------------------  Urinalysis No results found for: "COLORURINE", "APPEARANCEUR", "LABSPEC", "PHURINE", "GLUCOSEU", "HGBUR", "BILIRUBINUR", "KETONESUR", "PROTEINUR", "UROBILINOGEN", "NITRITE", "LEUKOCYTESUR"  ----------------------------------------------------------------------------------------------------------------   Imaging Results:    No results found.     Assessment & Plan:    Principal Problem:   GI bleed Active Problems:   Benign essential hypertension   Coronary artery disease   Gastro-esophageal reflux disease without esophagitis   S/P insertion of non-drug eluting coronary artery stent  Acute blood loss anemia GI bleed -Patient presents with melena, significant drop in hemoglobin from 12.5>8.7, - colonoscopy in the past showing evidence of diverticulosis and internal hemorrhoids -Hold Plavix and start  aspirin instead per gi recommendation (patient had PCI done and 06/2021, no indication for dual antiplatelet therapy at this point. -Given history of CAD, will transfuse 1 unit PRBC and target hemoglobin around 10 -monitor CBC closely and transfuse as needed -Continue with Protonix drip -Continue full liquid diet and n.p.o. after breakfast pending GI evaluation  NSTEMI, history of CAD with stenting and 06/2021 -Continue with aspirin, statin, beta-blockers -Transfuse manage PRBC to keep hemoglobin around 10  Pretension -Continue with metoprolol  Hyperlipidemia -Continue with home  dose statin   AAA:  -Patient was following with Dr. Arbie Cookey as an outpatient   History of ETOH use: -Reports last drink was 8 days ago, reports he only drink 1 to twice a week   DVT Prophylaxis  SCDs   AM Labs Ordered, also please review Full Orders  Family Communication: Admission, patients condition and plan of care including tests being ordered have been discussed with the patient who indicate understanding and agree with the plan and Code Status.  Code Status Full  Likely DC to  home  Condition GUARDED    Consults called: GI by ED    Admission status: inpaient    Time spent in minutes : 70 minutes   Huey Bienenstock M.D on 08/27/2022 at 9:39 PM  [facility] - Office  [phone]

## 2022-08-27 NOTE — Progress Notes (Addendum)
GI Office Note    Referring Provider: Gardenia Phlegm, MD Primary Care Physician:  Anabel Halon, MD  Primary Gastroenterologist: Hennie Duos. Marletta Lor, DO   Chief Complaint   Chief Complaint  Patient presents with   Rectal Bleeding    Been going on for about a week.      History of Present Illness   Jeremy Jenkins is a 72 y.o. male presenting today at the request of Dr. Barbaraann Faster for further evaluation of rectal bleeding.   Patient presents today with chief complaint of GI bleeding.  Symptoms occurring now for 5 to 6 days.  Describes dark blood per rectum, occurring 4-5 times per day.  Passing mostly blood and very little stool.  No obvious black stool.  He denies any abdominal pain.  He has a lot of gurgling and noises in his abdomen.  No nausea or vomiting.  Chronically on Plavix for history of stents.  Most recent stent was in February 2023.  Advised back in March by cardiology to drop his aspirin but he developed angina and therefore restarted it.  With current bleeding he stopped his aspirin again.  Has been taking Imodium trying to slow down stools.  They started to feel little bit fatigued, lightheaded with standing.  No heartburn, dysphagia.  No NSAIDs.  Not a lot of alcohol use.  He describes having an episode of rectal bleeding, bright red blood, in the setting of alcohol use several months back.   States his blood pressure typically runs systolic in the 120s to 130s.  Pulse usually in the 90s.  Today's blood pressure is 112/74, pulse 67.   Labs from Aug 24, 2022: Hemoglobin 12.6 up from 10.4 in December 2023.  MCV 89, white blood cell count 10,800, platelets 188,000.  Ferritin 29, iron saturation is 29, iron 93, TIBC 320.  Colonoscopy April 2021 at [facility]: -Moderate diverticulosis in the sigmoid colon -Return in 10 years for colonoscopy  Medications   Current Outpatient Medications  Medication Sig Dispense Refill   Azelastine HCl 137  MCG/SPRAY SOLN USE 2 SPRAY(S) IN EACH NOSTRIL TWICE DAILY AS DIRECTED 30 mL 0   clopidogrel (PLAVIX) 75 MG tablet Take 1 tablet by mouth once daily with breakfast 90 tablet 3   fluticasone (FLONASE) 50 MCG/ACT nasal spray Place 1 spray into both nostrils daily.     levocetirizine (XYZAL) 5 MG tablet TAKE 1 TABLET BY MOUTH ONCE DAILY IN THE EVENING 30 tablet 0   Melatonin 10 MG TABS Take by mouth.     metoprolol succinate (TOPROL-XL) 50 MG 24 hr tablet Take 1 tablet (50 mg total) by mouth daily. Take with or immediately following a meal. 90 tablet 1   Multiple Vitamin (QUINTABS) TABS Take 1 tablet by mouth daily.     nitroGLYCERIN (NITROSTAT) 0.4 MG SL tablet Place 1 tablet (0.4 mg total) under the tongue every 5 (five) minutes as needed for chest pain. 25 tablet 2   pantoprazole (PROTONIX) 40 MG tablet Take 1 tablet (40 mg total) by mouth 2 (two) times daily. 180 tablet 3   rosuvastatin (CRESTOR) 40 MG tablet Take 1 tablet (40 mg total) by mouth daily. 90 tablet 3   traMADol (ULTRAM) 50 MG tablet TAKE 1 TABLET BY MOUTH EVERY 12 HOURS AS NEEDED FOR MODERATE PAIN OR SEVERE PAIN 20 tablet 0   traZODone (DESYREL) 50 MG tablet Take 1 tablet (50 mg total) by mouth at bedtime. 90 tablet  0   iron polysaccharides (NU-IRON) 150 MG capsule Take 1 capsule (150 mg total) by mouth daily. (Patient not taking: Reported on 08/27/2022) 90 capsule 1   No current facility-administered medications for this visit.    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 Overview:  01/2011 Formatting of this note might be different from the original. 01/2011    Past Surgical History   Past Surgical History:  Procedure Laterality Date   CARDIAC CATHETERIZATION     CORONARY STENT INTERVENTION N/A 05/19/2021   Procedure: CORONARY STENT INTERVENTION;  Surgeon: Lyn Records, MD;  Location: [facility] INVASIVE CV LAB;  Service: Cardiovascular;  Laterality: N/A;   LEFT HEART CATH AND CORONARY ANGIOGRAPHY N/A 05/19/2021   Procedure: LEFT HEART CATH AND CORONARY ANGIOGRAPHY;  Surgeon: Lyn Records, MD;  Location: [facility] INVASIVE CV LAB;  Service: Cardiovascular;  Laterality: N/A;    Past Family History   Family History  Problem Relation Age of Onset   Sudden Cardiac Death Maternal Uncle     Past Social History   Social History   Socioeconomic History   Marital status: Single    Spouse name: Not on file   Number of children: Not on file   Years of education: Not on file   Highest education level: Not on file  Occupational History   Not on file  Tobacco Use   Smoking status: Former    Types: Cigarettes    Quit date: 06/02/2018    Years since quitting: 4.2   Smokeless tobacco: Never  Vaping Use   Vaping Use: Never used  Substance and Sexual Activity   Alcohol use: Not Currently   Drug use: Never   Sexual activity: Not on file  Other Topics Concern   Not on file  Social History Narrative   Not on file   Social Determinants of Health   Financial Resource Strain: Not on file  Food Insecurity: Not on file  Transportation Needs: Not on file  Physical Activity: Not on file  Stress: Not on file  Social Connections: Not on file  Intimate Partner Violence: Not on file    Review of Systems   General: Negative for anorexia, weight loss, fever, chills, fatigue, positive  weakness. Eyes: Negative for vision changes.  ENT: Negative for hoarseness, difficulty swallowing , nasal congestion. CV: Negative for chest pain, angina, palpitations, dyspnea on exertion, peripheral edema.  Respiratory: Negative for dyspnea at rest, dyspnea on exertion, cough, sputum, wheezing.  GI: See history of present illness. GU:  Negative for dysuria, hematuria, urinary incontinence, urinary frequency, nocturnal urination.  MS: Negative for joint pain, low back pain.  Derm: Negative for rash or itching.  Neuro: Negative for weakness, abnormal sensation, seizure, frequent headaches, memory loss,  confusion.  Psych: Negative for anxiety, depression, suicidal ideation, hallucinations.  Endo: Negative for unusual weight change.  Heme: Negative for bruising or bleeding. Allergy:  Negative for rash or hives.  Physical Exam   BP 112/74 (BP Location: Right Arm, Patient Position: Sitting, Cuff Size: Large)   Pulse 67   Temp 98 F (36.7 C) (Oral)   Ht 5\' 10"  (1.778 m)   Wt 199 lb (90.3 kg)   SpO2 96%   BMI 28.55 kg/m    General: Well-nourished, well-developed in no acute distress.  Head: Normocephalic, atraumatic.   Eyes: Conjunctiva pink, no icterus. Mouth: Oropharyngeal mucosa moist and pink , no lesions erythema or exudate. Neck: Supple without thyromegaly, masses, or lymphadenopathy.  Lungs: Clear to auscultation bilaterally.  Heart: Regular rate and rhythm, no murmurs rubs or gallops.  Abdomen: Bowel sounds are normal,   nondistended, no hepatosplenomegaly or masses, mild right mid abdominal pain. no abdominal bruits or hernia, no rebound or guarding.   Rectal: Not performed Extremities: No lower extremity edema. No clubbing or deformities.  Neuro: Alert and oriented x 4 , grossly normal neurologically.  Skin: Warm and dry, no rash or jaundice.   Psych: Alert and cooperative, normal mood and affect.  Labs   See HPI  Imaging Studies   No results found.  Assessment    GI bleeding: Occurring now for 5 to 6 days, multiple bloody stools per day, although he has had no stools in at lest 12 hours at this time. Patient describes dark blood per rectum but no outright melena. No abdominal pain.  Etiology could be diverticular bleed, or other small bowel/colon source, less likely rapid transit upper GI bleed. History of AAA, recently evaluated in 06/2022, 4.8 cm. Would be an unusual presentation for leaky aneurysm.   PLAN   Patient given ED precautions, present if ongoing bleeding, more fatigue/lightheadedness/sob, abdominal pain.  CBC today.  Will need colonoscopy+/-EGD as soon as possible but he is aware of the Holiday weekend outpatient procedure hours therefore if bleeding does not slow/stop or he has significant change in hemoglobin, he will have to go to the ED for admission.  I advised him to stop plavix. Take ASA 81mg  daily until his procedure.    Leanna Battles. Melvyn Neth, MHS, PA-C [facility]

## 2022-08-27 NOTE — ED Provider Notes (Signed)
[facility] EMERGENCY DEPARTMENT AT [facility] Provider Note   CSN: [id number] Arrival date & time: 08/27/22  1314     History {Add pertinent medical, surgical, social history, OB history to HPI:1} Chief Complaint  Patient presents with   abnormal labs    Jeremy Jenkins is a 72 y.o. male.  72 year old male with a history of NSTEMI status post PCI on Plavix, diverticulosis, and duodenal ulcer who presents to the emergency department with dark red stools.  Started having dark red stools 1 week ago.  Has been having 3 to 4/day that are loose.  Today has been feeling weak and dizzy.  Went to his GI doctors and they noticed that his hemoglobin had dropped significantly and brought him into the emergency department.  Has not taken his Plavix yet today.  No chest pain or shortness of breath.  No abdominal pain.       Home Medications Prior to Admission medications   Medication Sig Start Date End Date Taking? Authorizing Provider  Azelastine HCl 137 MCG/SPRAY SOLN USE 2 SPRAY(S) IN EACH NOSTRIL TWICE DAILY AS DIRECTED Patient taking differently: Inhale 2 sprays into the lungs daily. 07/23/22  Yes Anabel Halon, MD  clopidogrel (PLAVIX) 75 MG tablet Take 1 tablet by mouth once daily with breakfast 07/23/22  Yes Branch, Dorothe Pea, MD  Coenzyme Q10 (CO Q 10 PO) Take by mouth.   Yes [provider]  fluticasone (FLONASE) 50 MCG/ACT nasal spray Place 1 spray into both nostrils daily. 04/03/21  Yes [provider]  levocetirizine (XYZAL) 5 MG tablet TAKE 1 TABLET BY MOUTH ONCE DAILY IN THE EVENING 08/02/22  Yes Anabel Halon, MD  loperamide (IMODIUM) 2 MG capsule Take 2 mg by mouth as needed for diarrhea or loose stools.   Yes [provider]  Melatonin 10 MG TABS Take 10 mg by mouth at bedtime. 05/02/19  Yes [provider]  metoprolol succinate (TOPROL-XL) 50 MG 24 hr tablet Take 1 tablet (50 mg total) by mouth daily. Take with or immediately  following a meal. 03/23/22  Yes Anabel Halon, MD  Multiple Vitamin (QUINTABS) TABS Take 1 tablet by mouth daily. 05/02/19  Yes [provider]  nitroGLYCERIN (NITROSTAT) 0.4 MG SL tablet Place 1 tablet (0.4 mg total) under the tongue every 5 (five) minutes as needed for chest pain. 05/20/21  Yes Arty Baumgartner, NP  Omega-3 Fatty Acids (FISH OIL) 1000 MG CPDR Take 1 tablet by mouth 2 (two) times daily.   Yes [provider]  pantoprazole (PROTONIX) 40 MG tablet Take 1 tablet (40 mg total) by mouth 2 (two) times daily. 12/11/21  Yes Branch, Dorothe Pea, MD  rosuvastatin (CRESTOR) 40 MG tablet Take 1 tablet (40 mg total) by mouth daily. 12/29/21 12/24/22 Yes BranchDorothe Pea, MD  traZODone (DESYREL) 50 MG tablet Take 1 tablet (50 mg total) by mouth at bedtime. 03/23/22  Yes Anabel Halon, MD  Vitamin D-Vitamin K (VITAMIN K2-VITAMIN D3 PO) Take by mouth.   Yes [provider]  iron polysaccharides (NU-IRON) 150 MG capsule Take 1 capsule (150 mg total) by mouth daily. Patient not taking: Reported on 08/27/2022 08/25/22   Gardenia Phlegm, MD      Allergies    Albuterol, Azithromycin, Penicillins, Tadalafil, Aspartame and phenylalanine, Topiramate, Buspirone, and Hydrocodone    Review of Systems   Review of Systems  Physical Exam Updated Vital Signs BP 126/73   Pulse 77   Temp  98.7 F (37.1 C) (Oral)   Resp 16   Ht 5\' 10"  (1.778 m)   Wt 90.3 kg   SpO2 99%   BMI 28.55 kg/m  Physical Exam  ED Results / Procedures / Treatments   Labs (all labs ordered are listed, but only abnormal results are displayed) Labs Reviewed  COMPREHENSIVE METABOLIC PANEL - Abnormal; Notable for the following components:      Result Value   Glucose, Bld 152 (*)    Calcium 8.4 (*)    All other components within normal limits  CBC WITH DIFFERENTIAL/PLATELET - Abnormal; Notable for the following components:   WBC 11.9 (*)    RBC 2.99 (*)    Hemoglobin 8.7 (*)    HCT 27.2 (*)     RDW 16.4 (*)    Abs Immature Granulocytes 0.09 (*)    All other components within normal limits  POC OCCULT BLOOD, ED - Abnormal; Notable for the following components:   Fecal Occult Bld POSITIVE (*)    All other components within normal limits  PROTIME-INR  TYPE AND SCREEN    EKG None  Radiology No results found.  Procedures Procedures  {Document cardiac monitor, telemetry assessment procedure when appropriate:1}  Medications Ordered in ED Medications - No data to display  ED Course/ Medical Decision Making/ A&P Clinical Course as of 08/27/22 1848  Caleen Essex Aug 27, 2022  2956 Discussed with Dr. Marletta Lor from GI feels the patient will require EGD and colonoscopy on Sunday [RP]    Clinical Course User Index [RP] Rondel Baton, MD   {   Click here for ABCD2, HEART and other calculatorsREFRESH Note before signing :1}                          Medical Decision Making Risk Decision regarding hospitalization.   ***  {Document critical care time when appropriate:1} {Document review of labs and clinical decision tools ie heart score, Chads2Vasc2 etc:1}  {Document your independent review of radiology images, and any outside records:1} {Document your discussion with family members, caretakers, and with consultants:1} {Document social determinants of health affecting pt's care:1} {Document your decision making why or why not admission, treatments were needed:1} Final Clinical Impression(s) / ED Diagnoses Final diagnoses:  Gastrointestinal hemorrhage, unspecified gastrointestinal hemorrhage type    Rx / DC Orders ED Discharge Orders     None

## 2022-08-27 NOTE — ED Provider Triage Note (Signed)
Emergency Medicine Provider Triage Evaluation Note  Jeremy Jenkins , a 72 y.o. male  was evaluated in triage.  Pt complains of GI bleeding.  Pt sent here form gi office.  Pt has had blood and drop in hemoglobin   Review of Systems  Positive: Gi bleeding Negative: fever  Physical Exam  BP 113/79   Pulse 94   Temp 98.7 F (37.1 C) (Oral)   Resp 18   Ht 5\' 10"  (1.778 m)   Wt 90.3 kg   SpO2 97%   BMI 28.55 kg/m  Gen:   Awake, no distress Resp:  Normal effort  MSK:   Moves extremities without difficulty  Other:    Medical Decision Making  Medically screening exam initiated at 2:34 PM.  Appropriate orders placed.  Darlin Priestly Shankles was informed that the remainder of the evaluation will be completed by another provider, this initial triage assessment does not replace that evaluation, and the importance of remaining in the ED until their evaluation is complete.     Elson Areas, [place] 08/27/22 1435

## 2022-08-28 DIAGNOSIS — K922 Gastrointestinal hemorrhage, unspecified: Secondary | ICD-10-CM | POA: Diagnosis not present

## 2022-08-28 DIAGNOSIS — K5790 Diverticulosis of intestine, part unspecified, without perforation or abscess without bleeding: Secondary | ICD-10-CM

## 2022-08-28 DIAGNOSIS — D62 Acute posthemorrhagic anemia: Secondary | ICD-10-CM | POA: Diagnosis not present

## 2022-08-28 DIAGNOSIS — K921 Melena: Secondary | ICD-10-CM | POA: Diagnosis not present

## 2022-08-28 LAB — CBC
HCT: 26.9 % — ABNORMAL LOW (ref 39.0–52.0)
HCT: 26.7 % — ABNORMAL LOW (ref 39.0–52.0)
HCT: 30.5 % — ABNORMAL LOW (ref 39.0–52.0)
Hemoglobin: 8.6 g/dL — ABNORMAL LOW (ref 13.0–17.0)
Hemoglobin: 8.8 g/dL — ABNORMAL LOW (ref 13.0–17.0)
Hemoglobin: 9.8 g/dL — ABNORMAL LOW (ref 13.0–17.0)
MCH: 28.9 pg (ref 26.0–34.0)
MCH: 29.4 pg (ref 26.0–34.0)
MCH: 29.1 pg (ref 26.0–34.0)
MCHC: 32 g/dL (ref 30.0–36.0)
MCHC: 33 g/dL (ref 30.0–36.0)
MCHC: 32.1 g/dL (ref 30.0–36.0)
MCV: 90.3 fL (ref 80.0–100.0)
MCV: 89.3 fL (ref 80.0–100.0)
MCV: 90.5 fL (ref 80.0–100.0)
Platelets: 169 10*3/uL (ref 150–400)
Platelets: 172 10*3/uL (ref 150–400)
Platelets: 212 10*3/uL (ref 150–400)
RBC: 2.98 MIL/uL — ABNORMAL LOW (ref 4.22–5.81)
RBC: 2.99 MIL/uL — ABNORMAL LOW (ref 4.22–5.81)
RBC: 3.37 MIL/uL — ABNORMAL LOW (ref 4.22–5.81)
RDW: 16.2 % — ABNORMAL HIGH (ref 11.5–15.5)
RDW: 16.8 % — ABNORMAL HIGH (ref 11.5–15.5)
RDW: 16.8 % — ABNORMAL HIGH (ref 11.5–15.5)
WBC: 9.4 10*3/uL (ref 4.0–10.5)
WBC: 8.4 10*3/uL (ref 4.0–10.5)
WBC: 9.6 10*3/uL (ref 4.0–10.5)
nRBC: 0.2 % (ref 0.0–0.2)
nRBC: 0 % (ref 0.0–0.2)
nRBC: 0 % (ref 0.0–0.2)

## 2022-08-28 LAB — BASIC METABOLIC PANEL
Anion gap: 9 (ref 5–15)
BUN: 17 mg/dL (ref 8–23)
CO2: 23 mmol/L (ref 22–32)
Calcium: 7.7 mg/dL — ABNORMAL LOW (ref 8.9–10.3)
Chloride: 104 mmol/L (ref 98–111)
Creatinine, Ser: 1.04 mg/dL (ref 0.61–1.24)
GFR, Estimated: >60 mL/min (ref >60)
Glucose, Bld: 119 mg/dL — ABNORMAL HIGH (ref 70–99)
Potassium: 3.5 mmol/L (ref 3.5–5.1)
Sodium: 136 mmol/L (ref 135–145)

## 2022-08-28 LAB — PREPARE RBC (CROSSMATCH)

## 2022-08-28 LAB — HEMOGLOBIN AND HEMATOCRIT, BLOOD
HCT: 28.6 % — ABNORMAL LOW (ref 39.0–52.0)
Hemoglobin: 9.3 g/dL — ABNORMAL LOW (ref 13.0–17.0)

## 2022-08-28 LAB — BPAM RBC: Unit Type and Rh: 6200

## 2022-08-28 MED ORDER — SODIUM CHLORIDE 0.9% IV SOLUTION: Freq: Once | INTRAVENOUS | Status: AC

## 2022-08-28 MED ORDER — MELATONIN 3 MG PO TABS
9.0000 mg | ORAL_TABLET | Freq: Every day | ORAL | Status: DC
  Administered 2022-08-28: 9 mg via ORAL
  Filled 2022-08-28: qty 3

## 2022-08-28 MED ORDER — PEG 3350-KCL-NA BICARB-NACL 420 G PO SOLR
4000.0000 mL | Freq: Once | ORAL | Status: AC
  Administered 2022-08-28: 4000 mL via ORAL

## 2022-08-28 NOTE — Hospital Course (Addendum)
Jeremy Jenkins  is a 72 y.o. male, with past medical history of AAA, CAD status post PCI in 2013 and 2023, diverticulosis, hypertension, hyperlipidemia, patient presents to ED secondary to home plaints of dark stools/melena, patient was seen by GI earlier today, describes dark blood per rectum, occurring 4-5 times a day, intermittent, no nausea, no vomiting, has any NSAIDs use, reports using Plavix most recent today due to history of stents, reports his hemoglobin has dropped to 8.9 during GI visit today which brought with him to come to ED, he denies any chest pain, shortness of breath, no abdominal pain. ED: Workup significant for stable creatinine at 1.1, hemoglobin dropped at 8.7, was 12.6 only 3 days ago , platelets stable at 204,  Hemoccult positive.      Assessment & Plan:     Principal Problem:   GI bleed Active Problems:   Benign essential hypertension   Coronary artery disease   Gastro-esophageal reflux disease without esophagitis   S/P insertion of non-drug eluting coronary artery stent   Acute blood loss anemia GI bleed -Patient presents with melena, - significant drop in hemoglobin from 12.5>8.7,     Latest Ref Rng & Units 08/28/2022    4:12 AM 08/28/2022    1:26 AM 08/27/2022    3:08 PM  CBC  WBC 4.0 - 10.5 K/uL 8.4  9.4  11.9   Hemoglobin 13.0 - 17.0 g/dL 8.8  8.6  8.7   Hematocrit 39.0 - 52.0 % 26.7  26.9  27.2   Platelets 150 - 400 K/uL 172  169  204     - colonoscopy in the past showing evidence of diverticulosis and internal hemorrhoids -Hold Plavix and start  aspirin instead per gi recommendation (patient had PCI done and 06/2021, no indication for dual antiplatelet therapy at this point. -Given history of CAD, will transfusing 1 unit PRBC  (target hemoglobin around 10)  -monitor CBC closely and transfuse as needed  -Continue with Protonix drip -GI consulted: Planning for colonoscopy EGD   NSTEMI, history of CAD with stenting and 06/2021 -Continue with  aspirin, statin, beta-blockers -Transfuse manage PRBC to keep hemoglobin around 10 - D/C Plavix  Pretension -Continue with metoprolol   Hyperlipidemia -Continue with home dose statin   AAA:  -Patient was following with Dr. Arbie Cookey as an outpatient   History of ETOH use: -Reports last drink was 8 days ago, reports he only drink 1 to twice a week

## 2022-08-28 NOTE — H&P (View-Only) (Signed)
Consulting  Provider: Dr. Eloise Harman Primary Care Physician:  Anabel Halon, MD Primary Gastroenterologist:  Dr. Marletta Lor  Reason for Consultation: GI bleed, acute blood loss anemia  HPI:  Jeremy Jenkins is a 72 y.o. male with a past medical history of AAA, CAD status post PCI 2013 2023, diverticulosis, hypertension, dyslipidemia, chronic GERD, who presented to [facility] ER yesterday with complaint of dark red stools x 1 week.  Patient notes dark red blood per rectum, occurring 4-5 times per day. Passing mostly blood and very little stool. No obvious black stool. He denies any abdominal pain.  Does note that his stomach is "quite active."  No nausea or vomiting.   Chronically on Plavix for history of stents. Most recent stent was in February 2023. Advised back in March by cardiology to drop his aspirin but he developed angina and therefore restarted it. With current bleeding he stopped his aspirin again. Has been taking Imodium trying to slow down stools.  States since taking Imodium his bowel movements have slowed.  Denies any bleeding and 48 hours.  Chronic heartburn well-controlled on pantoprazole.  Denies dysphagia.  Denies any chronic NSAID use.   Hemoglobin in the ER 8.7.  9.3 today.  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 Overview:  01/2011 Formatting of this note might be different from the original. 01/2011    Past Surgical History:  Procedure Laterality Date   CARDIAC CATHETERIZATION     CORONARY STENT INTERVENTION N/A 05/19/2021   Procedure: CORONARY STENT INTERVENTION;  Surgeon: Lyn Records, MD;  Location: [facility]  INVASIVE CV LAB;  Service: Cardiovascular;  Laterality: N/A;   LEFT HEART CATH AND CORONARY ANGIOGRAPHY N/A 05/19/2021   Procedure: LEFT HEART CATH AND CORONARY ANGIOGRAPHY;  Surgeon: Lyn Records, MD;  Location: [facility] INVASIVE CV LAB;  Service: Cardiovascular;  Laterality: N/A;    Prior to Admission medications   Medication Sig Start Date End Date Taking? Authorizing Provider  Azelastine HCl 137 MCG/SPRAY SOLN USE 2 SPRAY(S) IN EACH NOSTRIL TWICE DAILY AS DIRECTED Patient taking differently: Inhale 2 sprays into the lungs daily. 07/23/22  Yes Anabel Halon, MD  clopidogrel (PLAVIX) 75 MG tablet Take 1 tablet by mouth once daily with breakfast 07/23/22  Yes Branch, Dorothe Pea, MD  Coenzyme Q10 (CO Q 10 PO) Take by mouth.   Yes [provider]  fluticasone (FLONASE) 50 MCG/ACT nasal spray Place 1 spray into both nostrils daily. 04/03/21  Yes [provider]  levocetirizine (XYZAL) 5 MG tablet TAKE 1 TABLET BY MOUTH ONCE DAILY IN THE EVENING 08/02/22  Yes Anabel Halon, MD  loperamide (IMODIUM) 2 MG capsule Take 2 mg by mouth as needed for diarrhea or loose stools.   Yes [provider]  Melatonin 10 MG TABS Take 10 mg by mouth at bedtime. 05/02/19  Yes [provider]  metoprolol succinate (TOPROL-XL) 50 MG 24 hr tablet Take 1 tablet (50 mg total) by mouth daily. Take with or immediately following a meal. 03/23/22  Yes Anabel Halon, MD  Multiple Vitamin (QUINTABS) TABS Take 1 tablet by mouth daily. 05/02/19  Yes [provider]  nitroGLYCERIN (NITROSTAT) 0.4 MG  SL tablet Place 1 tablet (0.4 mg total) under the tongue every 5 (five) minutes as needed for chest pain. 05/20/21  Yes Arty Baumgartner, NP  Omega-3 Fatty Acids (FISH OIL) 1000 MG CPDR Take 1 tablet by mouth 2 (two) times daily.   Yes [provider]  pantoprazole (PROTONIX) 40 MG tablet Take 1 tablet (40 mg total) by mouth 2 (two) times daily. 12/11/21  Yes Branch, Dorothe Pea, MD   rosuvastatin (CRESTOR) 40 MG tablet Take 1 tablet (40 mg total) by mouth daily. 12/29/21 12/24/22 Yes BranchDorothe Pea, MD  traZODone (DESYREL) 50 MG tablet Take 1 tablet (50 mg total) by mouth at bedtime. 03/23/22  Yes Anabel Halon, MD  Vitamin D-Vitamin K (VITAMIN K2-VITAMIN D3 PO) Take by mouth.   Yes [provider]  iron polysaccharides (NU-IRON) 150 MG capsule Take 1 capsule (150 mg total) by mouth daily. Patient not taking: Reported on 08/27/2022 08/25/22   Gardenia Phlegm, MD    Current Facility-Administered Medications  Medication Dose Route Frequency Provider Last Rate Last Admin   0.9 %  sodium chloride infusion (Manually program via Guardrails IV Fluids)   Intravenous Once Elgergawy, Leana Roe, MD       acetaminophen (TYLENOL) tablet 650 mg  650 mg Oral Q6H PRN Elgergawy, Leana Roe, MD       Or   acetaminophen (TYLENOL) suppository 650 mg  650 mg Rectal Q6H PRN Elgergawy, Leana Roe, MD       fluticasone (FLONASE) 50 MCG/ACT nasal spray 1 spray  1 spray Each Nare Daily Elgergawy, Leana Roe, MD   1 spray at 08/28/22 0825   hydrALAZINE (APRESOLINE) injection 5 mg  5 mg Intravenous Q4H PRN Elgergawy, Leana Roe, MD       loperamide (IMODIUM) capsule 2 mg  2 mg Oral PRN Elgergawy, Leana Roe, MD       loratadine (CLARITIN) tablet 10 mg  10 mg Oral Daily Pierce, Dwayne A, RPH   10 mg at 08/28/22 1610   melatonin tablet 9 mg  9 mg Oral QHS Shahmehdi, Seyed A, MD       metoprolol succinate (TOPROL-XL) 24 hr tablet 50 mg  50 mg Oral Daily Elgergawy, Leana Roe, MD   50 mg at 08/28/22 9604   multivitamin with minerals tablet 1 tablet  1 tablet Oral Daily Elgergawy, Leana Roe, MD   1 tablet at 08/28/22 5409   nitroGLYCERIN (NITROSTAT) SL tablet 0.4 mg  0.4 mg Sublingual Q5 min PRN Elgergawy, Leana Roe, MD       omega-3 acid ethyl esters (LOVAZA) capsule 1 g  1 g Oral BID Elgergawy, Leana Roe, MD   1 g at 08/28/22 8119   oxyCODONE (Oxy IR/ROXICODONE) immediate release tablet 5 mg  5 mg Oral Q4H  PRN Elgergawy, Leana Roe, MD       [START ON 08/31/2022] pantoprazole (PROTONIX) injection 40 mg  40 mg Intravenous Q12H Elgergawy, Leana Roe, MD       polyethylene glycol-electrolytes (NuLYTELY) solution 4,000 mL  4,000 mL Oral Once Earnest Bailey K, DO       rosuvastatin (CRESTOR) tablet 40 mg  40 mg Oral Daily Elgergawy, Leana Roe, MD   40 mg at 08/28/22 0906   traZODone (DESYREL) tablet 50 mg  50 mg Oral QHS Elgergawy, Leana Roe, MD   50 mg at 08/27/22 2230    Allergies as of 08/27/2022 - Review Complete 08/27/2022  Allergen Reaction Noted   Albuterol Anaphylaxis 07/15/2016  Azithromycin Tinitus 03/07/2014   Penicillins Shortness Of Breath 02/11/2011   Tadalafil Shortness Of Breath 04/03/2021   Aspartame and phenylalanine Other (See Comments) 02/11/2011   Topiramate Other (See Comments) 04/08/2011   Buspirone Other (See Comments) 09/09/2011   Hydrocodone Other (See Comments) 07/12/2011    Family History  Problem Relation Age of Onset   Sudden Cardiac Death Maternal Uncle     Social History   Socioeconomic History   Marital status: Single    Spouse name: Not on file   Number of children: Not on file   Years of education: Not on file   Highest education level: Not on file  Occupational History   Not on file  Tobacco Use   Smoking status: Former    Types: Cigarettes    Quit date: 06/02/2018    Years since quitting: 4.2   Smokeless tobacco: Never  Vaping Use   Vaping Use: Never used  Substance and Sexual Activity   Alcohol use: Not Currently   Drug use: Never   Sexual activity: Not on file  Other Topics Concern   Not on file  Social History Narrative   Not on file   Social Determinants of Health   Financial Resource Strain: Not on file  Food Insecurity: No Food Insecurity (08/27/2022)   Hunger Vital Sign    Worried About Running Out of Food in the Last Year: Never true    Ran Out of Food in the Last Year: Never true  Transportation Needs: No Transportation Needs  (08/27/2022)   PRAPARE - Administrator, Civil Service (Medical): No    Lack of Transportation (Non-Medical): No  Physical Activity: Not on file  Stress: Not on file  Social Connections: Not on file  Intimate Partner Violence: Not At Risk (08/27/2022)   Humiliation, Afraid, Rape, and Kick questionnaire    Fear of Current or Ex-Partner: No    Emotionally Abused: No    Physically Abused: No    Sexually Abused: No    Review of Systems: General: Negative for anorexia, weight loss, fever, chills, fatigue, weakness. Eyes: Negative for vision changes.  ENT: Negative for hoarseness, difficulty swallowing , nasal congestion. CV: Negative for chest pain, angina, palpitations, dyspnea on exertion, peripheral edema.  Respiratory: Negative for dyspnea at rest, dyspnea on exertion, cough, sputum, wheezing.  GI: See history of present illness. GU:  Negative for dysuria, hematuria, urinary incontinence, urinary frequency, nocturnal urination.  MS: Negative for joint pain, low back pain.  Derm: Negative for rash or itching.  Neuro: Negative for weakness, abnormal sensation, seizure, frequent headaches, memory loss, confusion.  Psych: Negative for anxiety, depression Endo: Negative for unusual weight change.  Heme: Negative for bruising or bleeding. Allergy: Negative for rash or hives.  Physical Exam: Vital signs in last 24 hours: Temp:  [97.3 F (36.3 C)-98.7 F (37.1 C)] 98.7 F (37.1 C) (05/25 0946) Pulse Rate:  [72-94] 81 (05/25 0946) Resp:  [15-19] 18 (05/25 0946) BP: (96-149)/(64-86) 107/70 (05/25 0946) SpO2:  [96 %-100 %] 98 % (05/25 0946) FiO2 (%):  [21 %] 21 % (05/24 2105) Weight:  [90.3 kg] 90.3 kg (05/24 1413) Last BM Date : 08/27/22 General:   Alert,  Well-developed, well-nourished, pleasant and cooperative in NAD Head:  Normocephalic and atraumatic. Eyes:  Sclera clear, no icterus.   Conjunctiva pink. Ears:  Normal auditory acuity. Nose:  No deformity, discharge,   or lesions. Mouth:  No deformity or lesions, dentition normal. Neck:  Supple; no masses  or thyromegaly. Lungs:  Clear throughout to auscultation.   No wheezes, crackles, or rhonchi. No acute distress. Heart:  Regular rate and rhythm; no murmurs, clicks, rubs,  or gallops. Abdomen:  Soft, nontender and nondistended. No masses, hepatosplenomegaly or hernias noted. Normal bowel sounds, without guarding, and without rebound.   Msk:  Symmetrical without gross deformities. Normal posture. Pulses:  Normal pulses noted. Extremities:  Without clubbing or edema. Neurologic:  Alert and  oriented x4;  grossly normal neurologically. Skin:  Intact without significant lesions or rashes. Cervical Nodes:  No significant cervical adenopathy. Psych:  Alert and cooperative. Normal mood and affect.  Intake/Output from previous day: 05/24 0701 - 05/25 0700 In: 488 [Blood:388; IV Piggyback:100] Out: -  Intake/Output this shift: No intake/output data recorded.  Lab Results: Recent Labs    08/27/22 1508 08/28/22 0126 08/28/22 0412 08/28/22 0754  WBC 11.9* 9.4 8.4  --   HGB 8.7* 8.6* 8.8* 9.3*  HCT 27.2* 26.9* 26.7* 28.6*  PLT 204 169 172  --    BMET Recent Labs    08/27/22 1508 08/28/22 0412  NA 135 136  K 4.1 3.5  CL 101 104  CO2 25 23  GLUCOSE 152* 119*  BUN 19 17  CREATININE 1.18 1.04  CALCIUM 8.4* 7.7*   LFT Recent Labs    08/27/22 1508  PROT 6.5  ALBUMIN 3.5  AST 16  ALT 9  ALKPHOS 48  BILITOT 0.7   PT/INR Recent Labs    08/27/22 1508  LABPROT 14.4  INR 1.1   Hepatitis Panel No results for input(s): "HEPBSAG", "HCVAB", "HEPAIGM", "HEPBIGM" in the last 72 hours. C-Diff No results for input(s): "CDIFFTOX" in the last 72 hours.  Studies/Results: No results found.  Impression: *Hematochezia *Acute blood loss anemia *Diverticulosis *Chronic GERD  Plan: Etiology of patient's bleeding unclear, likely diverticular.  Other potential causes include AVMs, malignancy,   peptic ulcer disease, esophagitis, gastritis, H. Pylori, duodenitis, or other.   Will prep colon today in anticipation of colonoscopy tomorrow morning.  Will perform upper endoscopy at the same time to evaluate upper GI tract.  The risks including infection, bleed, or perforation as well as benefits, limitations, alternatives and imponderables have been reviewed with the patient. Potential for esophageal dilation, biopsy, etc. have also been reviewed.  Questions have been answered. All parties agreeable.  Continue on IV Protonix.  Clear liquid diet today.  N.p.o. after midnight.  Continue monitor H&H and transfuse for less than 7 or evidence of hemodynamic instability.  If patient has evidence of profuse rectal bleeding, can consider stat CT angiogram to further localize.  Further recommendations to follow.  Thank you for the consultation.  Hennie Duos. Marletta Lor, D.O. Gastroenterology and Hepatology [facility]    LOS: 1 day     08/28/2022, 1:19 PM

## 2022-08-28 NOTE — TOC Progression Note (Signed)
Transition of Care [facility]) - Progression Note    Patient Details  Name: Jeremy Jenkins MRN: [id number] Date of Birth: [birth year]  [facility]) CM/SW Contact  Catalina Gravel, LCSW Phone Number: 08/28/2022, 3:09 PM  Clinical Narrative:    Pt will see GI Sunday for continued medical eval/tx. DC potentially Monday.      Barriers to Discharge: Continued Medical Work up  Expected Discharge Plan and Services                                               Social Determinants of Health (SDOH) Interventions SDOH Screenings   Food Insecurity: No Food Insecurity (08/27/2022)  Housing: Low Risk  (08/27/2022)  Transportation Needs: No Transportation Needs (08/27/2022)  Utilities: Not At Risk (08/27/2022)  Depression (PHQ2-9): Low Risk  (08/24/2022)  Tobacco Use: Medium Risk (08/27/2022)    Readmission Risk Interventions     No data to display

## 2022-08-28 NOTE — Consult Note (Signed)
Consulting  Provider: Dr. Paterson Primary Care Physician:  Patel, Rutwik K, MD Primary Gastroenterologist:  Dr. Taniah Reinecke  Reason for Consultation: GI bleed, acute blood loss anemia  HPI:  Jeremy Jenkins is a 72 y.o. male with a past medical history of AAA, CAD status post PCI 2013 2023, diverticulosis, hypertension, dyslipidemia, chronic GERD, who presented to [place], ER yesterday with complaint of dark red stools x 1 week.  Patient notes dark red blood per rectum, occurring 4-5 times per day. Passing mostly blood and very little stool. No obvious black stool. He denies any abdominal pain.  Does note that his stomach is "quite active."  No nausea or vomiting.   Chronically on Plavix for history of stents. Most recent stent was in February 2023. Advised back in March by cardiology to drop his aspirin but he developed angina and therefore restarted it. With current bleeding he stopped his aspirin again. Has been taking Imodium trying to slow down stools.  States since taking Imodium his bowel movements have slowed.  Denies any bleeding and 48 hours.  Chronic heartburn well-controlled on pantoprazole.  Denies dysphagia.  Denies any chronic NSAID use.   Hemoglobin in the ER 8.7.  9.3 today.  Colonoscopy April 2021 at [facility]: -Moderate diverticulosis in the sigmoid colon -Return in 10 years for colonoscopy  Past Medical History:  Diagnosis Date   AAA (abdominal aortic aneurysm) (HCC)    NSTEMI (non-ST elevated myocardial infarction) (HCC) 05/19/2021   Old myocardial infarction 09/11/2013   Formatting of this note might be different from the original. Overview:  01/2011 Formatting of this note might be different from the original. 01/2011    Past Surgical History:  Procedure Laterality Date   CARDIAC CATHETERIZATION     CORONARY STENT INTERVENTION N/A 05/19/2021   Procedure: CORONARY STENT INTERVENTION;  Surgeon: Smith, Henry W, MD;  Location: [facility]  INVASIVE CV LAB;  Service: Cardiovascular;  Laterality: N/A;   LEFT HEART CATH AND CORONARY ANGIOGRAPHY N/A 05/19/2021   Procedure: LEFT HEART CATH AND CORONARY ANGIOGRAPHY;  Surgeon: Smith, Henry W, MD;  Location: [facility] INVASIVE CV LAB;  Service: Cardiovascular;  Laterality: N/A;    Prior to Admission medications   Medication Sig Start Date End Date Taking? Authorizing Provider  Azelastine HCl 137 MCG/SPRAY SOLN USE 2 SPRAY(S) IN EACH NOSTRIL TWICE DAILY AS DIRECTED Patient taking differently: Inhale 2 sprays into the lungs daily. 07/23/22  Yes Patel, Rutwik K, MD  clopidogrel (PLAVIX) 75 MG tablet Take 1 tablet by mouth once daily with breakfast 07/23/22  Yes Branch, Jonathan F, MD  Coenzyme Q10 (CO Q 10 PO) Take by mouth.   Yes [provider]  fluticasone (FLONASE) 50 MCG/ACT nasal spray Place 1 spray into both nostrils daily. 04/03/21  Yes [provider]  levocetirizine (XYZAL) 5 MG tablet TAKE 1 TABLET BY MOUTH ONCE DAILY IN THE EVENING 08/02/22  Yes Patel, Rutwik K, MD  loperamide (IMODIUM) 2 MG capsule Take 2 mg by mouth as needed for diarrhea or loose stools.   Yes [provider]  Melatonin 10 MG TABS Take 10 mg by mouth at bedtime. 05/02/19  Yes [provider]  metoprolol succinate (TOPROL-XL) 50 MG 24 hr tablet Take 1 tablet (50 mg total) by mouth daily. Take with or immediately following a meal. 03/23/22  Yes Patel, Rutwik K, MD  Multiple Vitamin (QUINTABS) TABS Take 1 tablet by mouth daily. 05/02/19  Yes [provider]  nitroGLYCERIN (NITROSTAT) 0.4 MG   SL tablet Place 1 tablet (0.4 mg total) under the tongue every 5 (five) minutes as needed for chest pain. 05/20/21  Yes Roberts, Lindsay B, NP  Omega-3 Fatty Acids (FISH OIL) 1000 MG CPDR Take 1 tablet by mouth 2 (two) times daily.   Yes [provider]  pantoprazole (PROTONIX) 40 MG tablet Take 1 tablet (40 mg total) by mouth 2 (two) times daily. 12/11/21  Yes Branch, Jonathan F, MD   rosuvastatin (CRESTOR) 40 MG tablet Take 1 tablet (40 mg total) by mouth daily. 12/29/21 12/24/22 Yes Branch, Jonathan F, MD  traZODone (DESYREL) 50 MG tablet Take 1 tablet (50 mg total) by mouth at bedtime. 03/23/22  Yes Patel, Rutwik K, MD  Vitamin D-Vitamin K (VITAMIN K2-VITAMIN D3 PO) Take by mouth.   Yes [provider]  iron polysaccharides (NU-IRON) 150 MG capsule Take 1 capsule (150 mg total) by mouth daily. Patient not taking: Reported on 08/27/2022 08/25/22   Steen, James J, MD    Current Facility-Administered Medications  Medication Dose Route Frequency Provider Last Rate Last Admin   0.9 %  sodium chloride infusion (Manually program via Guardrails IV Fluids)   Intravenous Once Elgergawy, Dawood S, MD       acetaminophen (TYLENOL) tablet 650 mg  650 mg Oral Q6H PRN Elgergawy, Dawood S, MD       Or   acetaminophen (TYLENOL) suppository 650 mg  650 mg Rectal Q6H PRN Elgergawy, Dawood S, MD       fluticasone (FLONASE) 50 MCG/ACT nasal spray 1 spray  1 spray Each Nare Daily Elgergawy, Dawood S, MD   1 spray at 08/28/22 0825   hydrALAZINE (APRESOLINE) injection 5 mg  5 mg Intravenous Q4H PRN Elgergawy, Dawood S, MD       loperamide (IMODIUM) capsule 2 mg  2 mg Oral PRN Elgergawy, Dawood S, MD       loratadine (CLARITIN) tablet 10 mg  10 mg Oral Daily Pierce, Dwayne A, RPH   10 mg at 08/28/22 0907   melatonin tablet 9 mg  9 mg Oral QHS Shahmehdi, Seyed A, MD       metoprolol succinate (TOPROL-XL) 24 hr tablet 50 mg  50 mg Oral Daily Elgergawy, Dawood S, MD   50 mg at 08/28/22 0906   multivitamin with minerals tablet 1 tablet  1 tablet Oral Daily Elgergawy, Dawood S, MD   1 tablet at 08/28/22 0907   nitroGLYCERIN (NITROSTAT) SL tablet 0.4 mg  0.4 mg Sublingual Q5 min PRN Elgergawy, Dawood S, MD       omega-3 acid ethyl esters (LOVAZA) capsule 1 g  1 g Oral BID Elgergawy, Dawood S, MD   1 g at 08/28/22 0906   oxyCODONE (Oxy IR/ROXICODONE) immediate release tablet 5 mg  5 mg Oral Q4H  PRN Elgergawy, Dawood S, MD       [START ON 08/31/2022] pantoprazole (PROTONIX) injection 40 mg  40 mg Intravenous Q12H Elgergawy, Dawood S, MD       polyethylene glycol-electrolytes (NuLYTELY) solution 4,000 mL  4,000 mL Oral Once Dalan Cowger K, DO       rosuvastatin (CRESTOR) tablet 40 mg  40 mg Oral Daily Elgergawy, Dawood S, MD   40 mg at 08/28/22 0906   traZODone (DESYREL) tablet 50 mg  50 mg Oral QHS Elgergawy, Dawood S, MD   50 mg at 08/27/22 2230    Allergies as of 08/27/2022 - Review Complete 08/27/2022  Allergen Reaction Noted   Albuterol Anaphylaxis 07/15/2016     Azithromycin Tinitus 03/07/2014   Penicillins Shortness Of Breath 02/11/2011   Tadalafil Shortness Of Breath 04/03/2021   Aspartame and phenylalanine Other (See Comments) 02/11/2011   Topiramate Other (See Comments) 04/08/2011   Buspirone Other (See Comments) 09/09/2011   Hydrocodone Other (See Comments) 07/12/2011    Family History  Problem Relation Age of Onset   Sudden Cardiac Death Maternal Uncle     Social History   Socioeconomic History   Marital status: Single    Spouse name: Not on file   Number of children: Not on file   Years of education: Not on file   Highest education level: Not on file  Occupational History   Not on file  Tobacco Use   Smoking status: Former    Types: Cigarettes    Quit date: 06/02/2018    Years since quitting: 4.2   Smokeless tobacco: Never  Vaping Use   Vaping Use: Never used  Substance and Sexual Activity   Alcohol use: Not Currently   Drug use: Never   Sexual activity: Not on file  Other Topics Concern   Not on file  Social History Narrative   Not on file   Social Determinants of Health   Financial Resource Strain: Not on file  Food Insecurity: No Food Insecurity (08/27/2022)   Hunger Vital Sign    Worried About Running Out of Food in the Last Year: Never true    Ran Out of Food in the Last Year: Never true  Transportation Needs: No Transportation Needs  (08/27/2022)   PRAPARE - Transportation    Lack of Transportation (Medical): No    Lack of Transportation (Non-Medical): No  Physical Activity: Not on file  Stress: Not on file  Social Connections: Not on file  Intimate Partner Violence: Not At Risk (08/27/2022)   Humiliation, Afraid, Rape, and Kick questionnaire    Fear of Current or Ex-Partner: No    Emotionally Abused: No    Physically Abused: No    Sexually Abused: No    Review of Systems: General: Negative for anorexia, weight loss, fever, chills, fatigue, weakness. Eyes: Negative for vision changes.  ENT: Negative for hoarseness, difficulty swallowing , nasal congestion. CV: Negative for chest pain, angina, palpitations, dyspnea on exertion, peripheral edema.  Respiratory: Negative for dyspnea at rest, dyspnea on exertion, cough, sputum, wheezing.  GI: See history of present illness. GU:  Negative for dysuria, hematuria, urinary incontinence, urinary frequency, nocturnal urination.  MS: Negative for joint pain, low back pain.  Derm: Negative for rash or itching.  Neuro: Negative for weakness, abnormal sensation, seizure, frequent headaches, memory loss, confusion.  Psych: Negative for anxiety, depression Endo: Negative for unusual weight change.  Heme: Negative for bruising or bleeding. Allergy: Negative for rash or hives.  Physical Exam: Vital signs in last 24 hours: Temp:  [97.3 F (36.3 C)-98.7 F (37.1 C)] 98.7 F (37.1 C) (05/25 0946) Pulse Rate:  [72-94] 81 (05/25 0946) Resp:  [15-19] 18 (05/25 0946) BP: (96-149)/(64-86) 107/70 (05/25 0946) SpO2:  [96 %-100 %] 98 % (05/25 0946) FiO2 (%):  [21 %] 21 % (05/24 2105) Weight:  [90.3 kg] 90.3 kg (05/24 1413) Last BM Date : 08/27/22 General:   Alert,  Well-developed, well-nourished, pleasant and cooperative in NAD Head:  Normocephalic and atraumatic. Eyes:  Sclera clear, no icterus.   Conjunctiva pink. Ears:  Normal auditory acuity. Nose:  No deformity, discharge,   or lesions. Mouth:  No deformity or lesions, dentition normal. Neck:  Supple; no masses   or thyromegaly. Lungs:  Clear throughout to auscultation.   No wheezes, crackles, or rhonchi. No acute distress. Heart:  Regular rate and rhythm; no murmurs, clicks, rubs,  or gallops. Abdomen:  Soft, nontender and nondistended. No masses, hepatosplenomegaly or hernias noted. Normal bowel sounds, without guarding, and without rebound.   Msk:  Symmetrical without gross deformities. Normal posture. Pulses:  Normal pulses noted. Extremities:  Without clubbing or edema. Neurologic:  Alert and  oriented x4;  grossly normal neurologically. Skin:  Intact without significant lesions or rashes. Cervical Nodes:  No significant cervical adenopathy. Psych:  Alert and cooperative. Normal mood and affect.  Intake/Output from previous day: 05/24 0701 - 05/25 0700 In: 488 [Blood:388; IV Piggyback:100] Out: -  Intake/Output this shift: No intake/output data recorded.  Lab Results: Recent Labs    08/27/22 1508 08/28/22 0126 08/28/22 0412 08/28/22 0754  WBC 11.9* 9.4 8.4  --   HGB 8.7* 8.6* 8.8* 9.3*  HCT 27.2* 26.9* 26.7* 28.6*  PLT 204 169 172  --    BMET Recent Labs    08/27/22 1508 08/28/22 0412  NA 135 136  K 4.1 3.5  CL 101 104  CO2 25 23  GLUCOSE 152* 119*  BUN 19 17  CREATININE 1.18 1.04  CALCIUM 8.4* 7.7*   LFT Recent Labs    08/27/22 1508  PROT 6.5  ALBUMIN 3.5  AST 16  ALT 9  ALKPHOS 48  BILITOT 0.7   PT/INR Recent Labs    08/27/22 1508  LABPROT 14.4  INR 1.1   Hepatitis Panel No results for input(s): "HEPBSAG", "HCVAB", "HEPAIGM", "HEPBIGM" in the last 72 hours. C-Diff No results for input(s): "CDIFFTOX" in the last 72 hours.  Studies/Results: No results found.  Impression: *Hematochezia *Acute blood loss anemia *Diverticulosis *Chronic GERD  Plan: Etiology of patient's bleeding unclear, likely diverticular.  Other potential causes include AVMs, malignancy,   peptic ulcer disease, esophagitis, gastritis, H. Pylori, duodenitis, or other.   Will prep colon today in anticipation of colonoscopy tomorrow morning.  Will perform upper endoscopy at the same time to evaluate upper GI tract.  The risks including infection, bleed, or perforation as well as benefits, limitations, alternatives and imponderables have been reviewed with the patient. Potential for esophageal dilation, biopsy, etc. have also been reviewed.  Questions have been answered. All parties agreeable.  Continue on IV Protonix.  Clear liquid diet today.  N.p.o. after midnight.  Continue monitor H&H and transfuse for less than 7 or evidence of hemodynamic instability.  If patient has evidence of profuse rectal bleeding, can consider stat CT angiogram to further localize.  Further recommendations to follow.  Thank you for the consultation.  Wessie Shanks K. Louella Medaglia, D.O. Gastroenterology and Hepatology [facility]    LOS: 1 day     08/28/2022, 1:19 PM    

## 2022-08-28 NOTE — Progress Notes (Addendum)
PROGRESS NOTE    Patient: Jeremy Jenkins                            PCP: Anabel Halon, MD                    DOB: 05-20-1950            DOA: 08/27/2022 ZOX:[id number]             DOS: 08/28/2022, 9:30 AM   LOS: 1 day   Date of Service: The patient was seen and examined on 08/28/2022  Subjective:   The patient was seen and examined this morning. Hemodynamically stable. No issues overnight .  Brief Narrative:   Jeremy Jenkins  is a 72 y.o. male, with past medical history of AAA, CAD status post PCI in 2013 and 2023, diverticulosis, hypertension, hyperlipidemia, patient presents to ED secondary to home plaints of dark stools/melena, patient was seen by GI earlier today, describes dark blood per rectum, occurring 4-5 times a day, intermittent, no nausea, no vomiting, has any NSAIDs use, reports using Plavix most recent today due to history of stents, reports his hemoglobin has dropped to 8.9 during GI visit today which brought with him to come to ED, he denies any chest pain, shortness of breath, no abdominal pain. ED: Workup significant for stable creatinine at 1.1, hemoglobin dropped at 8.7, was 12.6 only 3 days ago , platelets stable at 204,  Hemoccult positive.      Assessment & Plan:     Principal Problem:   GI bleed Active Problems:   Benign essential hypertension   Coronary artery disease   Gastro-esophageal reflux disease without esophagitis   S/P insertion of non-drug eluting coronary artery stent   Acute blood loss anemia GI bleed -Patient presents with melena, - significant drop in hemoglobin from 12.5>8.7,     Latest Ref Rng & Units 08/28/2022    4:12 AM 08/28/2022    1:26 AM 08/27/2022    3:08 PM  CBC  WBC 4.0 - 10.5 K/uL 8.4  9.4  11.9   Hemoglobin 13.0 - 17.0 g/dL 8.8  8.6  8.7   Hematocrit 39.0 - 52.0 % 26.7  26.9  27.2   Platelets 150 - 400 K/uL 172  169  204     - colonoscopy in the past showing evidence of diverticulosis and internal  hemorrhoids -Hold Plavix and start  aspirin instead per gi recommendation (patient had PCI done and 06/2021, no indication for dual antiplatelet therapy at this point. -Given history of CAD, will transfusing 1 unit PRBC  (target hemoglobin around 10)  -monitor CBC closely and transfuse as needed  -Continue with Protonix drip -GI consulted: Planning for colonoscopy EGD   NSTEMI, history of CAD with stenting and 06/2021 -Continue with aspirin, statin, beta-blockers -Transfuse manage PRBC to keep hemoglobin around 10 - D/C Plavix  Pretension -Continue with metoprolol   Hyperlipidemia -Continue with home dose statin   AAA:  -Patient was following with Dr. Arbie Cookey as an outpatient   History of ETOH use: -Reports last drink was 8 days ago, reports he only drink 1 to twice a week          ----------------------------------------------------------------------------------------------------------------------------------------------- Nutritional status:  The patient's BMI is: Body mass index is 28.55 kg/m. I agree with the assessment and plan as outlined ----------------------------------------------------------------------------------------------------------------------  DVT prophylaxis:  SCDs Start: 08/27/22 2105   Code Status:  Code Status: Full Code  Family Communication: No family member present at bedside- attempt will be made to update daily The above findings and plan of care has been discussed with patient (and family)  in detail,  they expressed understanding and agreement of above. -Advance care planning has been discussed.   Admission status:   Status is: Inpatient Remains inpatient appropriate because: Needing GI workup, possible endoscopy for GI bleed   Disposition: From  - home             Planning for discharge in 1-2 days: to   Procedures:   No admission procedures for hospital encounter.   Antimicrobials:  Anti-infectives (From admission, onward)     None        Medication:   sodium chloride   Intravenous Once   fluticasone  1 spray Each Nare Daily   loratadine  10 mg Oral Daily   melatonin  9 mg Oral QHS   metoprolol succinate  50 mg Oral Daily   multivitamin with minerals  1 tablet Oral Daily   omega-3 acid ethyl esters  1 g Oral BID   [START ON 08/31/2022] pantoprazole  40 mg Intravenous Q12H   rosuvastatin  40 mg Oral Daily   traZODone  50 mg Oral QHS    acetaminophen **OR** acetaminophen, hydrALAZINE, loperamide, nitroGLYCERIN, oxyCODONE   Objective:   Vitals:   08/27/22 2135 08/28/22 0034 08/28/22 0356 08/28/22 0740  BP: 124/72 111/69 96/64 119/75  Pulse: 81 72 73 83  Resp: 18 18 16 19   Temp: 98.3 F (36.8 C) (!) 97.3 F (36.3 C) 98.1 F (36.7 C) 98.3 F (36.8 C)  TempSrc:  Oral  Oral  SpO2:  97% 97% 96%  Weight:      Height:        Intake/Output Summary (Last 24 hours) at 08/28/2022 0930 Last data filed at 08/28/2022 0034 Gross per 24 hour  Intake 488 ml  Output --  Net 488 ml   Filed Weights   08/27/22 1413  Weight: 90.3 kg     Physical examination:   Constitution:  Alert, cooperative, no distress,  Appears calm and comfortable  Psychiatric:   Normal and stable mood and affect, cognition intact,   HEENT:        Normocephalic, PERRL, otherwise with in Normal limits  Chest:         Chest symmetric Cardio vascular:  S1/S2, RRR, No murmure, No Rubs or Gallops  pulmonary: Clear to auscultation bilaterally, respirations unlabored, negative wheezes / crackles Abdomen: Soft, non-tender, non-distended, bowel sounds,no masses, no organomegaly Muscular skeletal: Limited exam - in bed, able to move all 4 extremities,   Neuro: CNII-XII intact. , normal motor and sensation, reflexes intact  Extremities: No pitting edema lower extremities, +2 pulses  Skin: Dry, warm to touch, negative for any Rashes, No open wounds Wounds: per nursing  documentation   ------------------------------------------------------------------------------------------------------------------------------------------    LABs:     Latest Ref Rng & Units 08/28/2022    7:54 AM 08/28/2022    4:12 AM 08/28/2022    1:26 AM  CBC  WBC 4.0 - 10.5 K/uL  8.4  9.4   Hemoglobin 13.0 - 17.0 g/dL 9.3  8.8  8.6   Hematocrit 39.0 - 52.0 % 28.6  26.7  26.9   Platelets 150 - 400 K/uL  172  169       Latest Ref Rng & Units 08/28/2022    4:12 AM 08/27/2022    3:08 PM 03/19/2022  11:43 AM  CMP  Glucose 70 - 99 mg/dL 161  096  045   BUN 8 - 23 mg/dL 17  19  15    Creatinine 0.61 - 1.24 mg/dL 4.09  8.11  9.14   Sodium 135 - 145 mmol/L 136  135  141   Potassium 3.5 - 5.1 mmol/L 3.5  4.1  5.0   Chloride 98 - 111 mmol/L 104  101  105   CO2 22 - 32 mmol/L 23  25  22    Calcium 8.9 - 10.3 mg/dL 7.7  8.4  8.8   Total Protein 6.5 - 8.1 g/dL  6.5  6.7   Total Bilirubin 0.3 - 1.2 mg/dL  0.7  0.5   Alkaline Phos 38 - 126 U/L  48  69   AST 15 - 41 U/L  16  15   ALT 0 - 44 U/L  9  6        Micro Results No results found for this or any previous visit (from the past 240 hour(s)).  Radiology Reports No results found.  SIGNED: Kendell Bane, MD, FHM. FAAFP. [facility] - [facility] Time spent - 50 min.  In seeing, evaluating and examining the patient. Reviewing medical records, labs, drawn plan of care. [facility],  Pager (please use [email or web address] to page/ text) Please use Epic Secure Chat for non-urgent communication (7AM-7PM)  If 7PM-7AM, please contact night-coverage [email or web address], 08/28/2022, 9:30 AM

## 2022-08-29 ENCOUNTER — Encounter (HOSPITAL_COMMUNITY)
Admission: EM | Disposition: A | Discharge status: Home / Self Care | Payer: Self-pay | Source: Home / Self Care | Attending: Family Medicine

## 2022-08-29 ENCOUNTER — Inpatient Hospital Stay (HOSPITAL_COMMUNITY): Payer: Medicare HMO | Admitting: Anesthesiology

## 2022-08-29 DIAGNOSIS — Z87891 Personal history of nicotine dependence: Secondary | ICD-10-CM | POA: Diagnosis not present

## 2022-08-29 DIAGNOSIS — K921 Melena: Secondary | ICD-10-CM

## 2022-08-29 DIAGNOSIS — I10 Essential (primary) hypertension: Secondary | ICD-10-CM | POA: Diagnosis not present

## 2022-08-29 DIAGNOSIS — I714 Abdominal aortic aneurysm, without rupture, unspecified: Secondary | ICD-10-CM | POA: Diagnosis not present

## 2022-08-29 DIAGNOSIS — K5730 Diverticulosis of large intestine without perforation or abscess without bleeding: Secondary | ICD-10-CM

## 2022-08-29 DIAGNOSIS — I2510 Atherosclerotic heart disease of native coronary artery without angina pectoris: Secondary | ICD-10-CM | POA: Diagnosis not present

## 2022-08-29 DIAGNOSIS — K449 Diaphragmatic hernia without obstruction or gangrene: Secondary | ICD-10-CM

## 2022-08-29 DIAGNOSIS — E785 Hyperlipidemia, unspecified: Secondary | ICD-10-CM | POA: Diagnosis not present

## 2022-08-29 DIAGNOSIS — D62 Acute posthemorrhagic anemia: Secondary | ICD-10-CM | POA: Diagnosis not present

## 2022-08-29 DIAGNOSIS — K219 Gastro-esophageal reflux disease without esophagitis: Secondary | ICD-10-CM

## 2022-08-29 DIAGNOSIS — I252 Old myocardial infarction: Secondary | ICD-10-CM | POA: Diagnosis not present

## 2022-08-29 DIAGNOSIS — K648 Other hemorrhoids: Secondary | ICD-10-CM | POA: Diagnosis not present

## 2022-08-29 DIAGNOSIS — Z955 Presence of coronary angioplasty implant and graft: Secondary | ICD-10-CM | POA: Diagnosis not present

## 2022-08-29 HISTORY — PX: COLONOSCOPY WITH PROPOFOL: SHX5780

## 2022-08-29 HISTORY — PX: ESOPHAGOGASTRODUODENOSCOPY (EGD) WITH PROPOFOL: SHX5813

## 2022-08-29 LAB — CBC
HCT: 28.9 % — ABNORMAL LOW (ref 39.0–52.0)
Hemoglobin: 9.1 g/dL — ABNORMAL LOW (ref 13.0–17.0)
MCH: 29 pg (ref 26.0–34.0)
MCHC: 31.5 g/dL (ref 30.0–36.0)
MCV: 92 fL (ref 80.0–100.0)
Platelets: 197 10*3/uL (ref 150–400)
RBC: 3.14 MIL/uL — ABNORMAL LOW (ref 4.22–5.81)
RDW: 16.7 % — ABNORMAL HIGH (ref 11.5–15.5)
WBC: 8.1 10*3/uL (ref 4.0–10.5)
nRBC: 0 % (ref 0.0–0.2)

## 2022-08-29 LAB — BPAM RBC
Blood Product Expiration Date: 202406142359
Blood Product Expiration Date: 202406272359

## 2022-08-29 SURGERY — ESOPHAGOGASTRODUODENOSCOPY (EGD) WITH PROPOFOL
Anesthesia: General

## 2022-08-29 MED ORDER — PHENYLEPHRINE 80 MCG/ML (10ML) SYRINGE FOR IV PUSH (FOR BLOOD PRESSURE SUPPORT)
PREFILLED_SYRINGE | INTRAVENOUS | Status: DC | PRN
  Administered 2022-08-29: 160 ug via INTRAVENOUS
  Administered 2022-08-29 (×3): 80 ug via INTRAVENOUS

## 2022-08-29 MED ORDER — PROPOFOL 500 MG/50ML IV EMUL
INTRAVENOUS | Status: DC | PRN
  Administered 2022-08-29: 150 ug/kg/min via INTRAVENOUS

## 2022-08-29 MED ORDER — LIDOCAINE 2% (20 MG/ML) 5 ML SYRINGE
INTRAMUSCULAR | Status: DC | PRN
  Administered 2022-08-29: 60 mg via INTRAVENOUS

## 2022-08-29 MED ORDER — PROPOFOL 500 MG/50ML IV EMUL
INTRAVENOUS | Status: AC
  Filled 2022-08-29: qty 50

## 2022-08-29 MED ORDER — SODIUM CHLORIDE 0.9 % IV SOLN: INTRAVENOUS | Status: DC

## 2022-08-29 MED ORDER — ASPIRIN 325 MG PO TBEC: 325.0000 mg | DELAYED_RELEASE_TABLET | Freq: Every day | ORAL | 3 refills | Status: DC

## 2022-08-29 MED ORDER — PHENYLEPHRINE 80 MCG/ML (10ML) SYRINGE FOR IV PUSH (FOR BLOOD PRESSURE SUPPORT)
PREFILLED_SYRINGE | INTRAVENOUS | Status: AC
  Filled 2022-08-29: qty 10

## 2022-08-29 MED ORDER — PROPOFOL 10 MG/ML IV BOLUS
INTRAVENOUS | Status: DC | PRN
  Administered 2022-08-29: 100 mg via INTRAVENOUS

## 2022-08-29 MED ORDER — LACTATED RINGERS IV SOLN: INTRAVENOUS | Status: DC | PRN

## 2022-08-29 MED ORDER — LIDOCAINE HCL (PF) 2 % IJ SOLN
INTRAMUSCULAR | Status: AC
  Filled 2022-08-29: qty 5

## 2022-08-29 NOTE — Op Note (Signed)
[facility] Patient Name: Jeremy Jenkins Procedure Date: 08/29/2022 8:34 AM MRN: [id number] Date of Birth: 03-16-1951 Attending MD: Hennie Duos. Marletta Lor , [place], [id number] CSN: [id number] Age: 72 Admit Type: Inpatient Procedure:                Upper GI endoscopy Indications:              Acute post hemorrhagic anemia, Hematochezia Providers:                Hennie Duos. Marletta Lor, DO, Nena Polio, RN, Durwin Glaze Tech, Technician Referring MD:              Medicines:                See the Anesthesia note for documentation of the                            administered medications Complications:            No immediate complications. Estimated Blood Loss:     Estimated blood loss: none. Procedure:                Pre-Anesthesia Assessment:                           - The anesthesia plan was to use monitored                            anesthesia care (MAC).                           After obtaining informed consent, the endoscope was                            passed under direct vision. Throughout the                            procedure, the patient's blood pressure, pulse, and                            oxygen saturations were monitored continuously. The                            [id number]-[id number] ([id number]) scope was introduced through the                            mouth, and advanced to the second part of duodenum.                            The upper GI endoscopy was accomplished without                            difficulty. The patient tolerated the procedure                            well. Scope  In: 9:13:35 AM Scope Out: 9:15:49 AM Total Procedure Duration: 0 hours 2 minutes 14 seconds  Findings:      A small hiatal hernia was present.      There is no endoscopic evidence of bleeding, areas of erosion,       esophagitis, ulcerations or varices in the entire esophagus.      The entire examined stomach was normal.      The duodenal bulb, first portion of  the duodenum and second portion of       the duodenum were normal. Impression:               - Small hiatal hernia.                           - Normal stomach.                           - Normal duodenal bulb, first portion of the                            duodenum and second portion of the duodenum.                           - No specimens collected. Moderate Sedation:      Per Anesthesia Care Recommendation:           - Proceed with colonoscopy to further evaluate.                            Please see Colonoscopy report for recommendations. Procedure Code(s):        --- Professional ---                           [phone], Esophagogastroduodenoscopy, flexible,                            transoral; diagnostic, including collection of                            specimen(s) by brushing or washing, when performed                            (separate procedure) Diagnosis Code(s):        --- Professional ---                           K44.9, Diaphragmatic hernia without obstruction or                            gangrene                           D62, Acute posthemorrhagic anemia                           K92.1, Melena (includes Hematochezia) CPT copyright 2022 American [employer] Association. All rights reserved. The codes documented in this report are preliminary and upon coder review may  be revised to meet current  compliance requirements. Hennie Duos. Marletta Lor, DO Hennie Duos. Marletta Lor, DO 08/29/2022 9:17:59 AM This report has been signed electronically. Number of Addenda: 0

## 2022-08-29 NOTE — Op Note (Signed)
[facility] Patient Name: Jeremy Jenkins Procedure Date: 08/29/2022 8:34 AM MRN: [id number] Date of Birth: Aug 27, 1950 Attending MD: Hennie Duos. Marletta Lor , [place], [id number] CSN: [id number] Age: 72 Admit Type: Inpatient Procedure:                Colonoscopy Indications:              Hematochezia, Acute post hemorrhagic anemia Providers:                Hennie Duos. Marletta Lor, DO, Nena Polio, RN, Durwin Glaze Tech, Technician Referring MD:              Medicines:                See the Anesthesia note for documentation of the                            administered medications Complications:            No immediate complications. Estimated Blood Loss:     Estimated blood loss: none. Procedure:                Pre-Anesthesia Assessment:                           - The anesthesia plan was to use monitored                            anesthesia care (MAC).                           After obtaining informed consent, the colonoscope                            was passed under direct vision. Throughout the                            procedure, the patient's blood pressure, pulse, and                            oxygen saturations were monitored continuously. The                            [id number]-HQ190L ([id number]) scope was introduced through                            the anus and advanced to the the terminal ileum,                            with identification of the appendiceal orifice and                            IC valve. The colonoscopy was performed without                            difficulty. The patient tolerated  the procedure                            well. The quality of the bowel preparation was                            evaluated using the BBPS [facility] Bowel Preparation                            Scale) with scores of: Right Colon = 3, Transverse                            Colon = 3 and Left Colon = 3 (entire mucosa seen                            well  with no residual staining, small fragments of                            stool or opaque liquid). The total BBPS score                            equals 9. Scope In: 9:19:35 AM Scope Out: 9:29:31 AM Scope Withdrawal Time: 0 hours 7 minutes 15 seconds  Total Procedure Duration: 0 hours 9 minutes 56 seconds  Findings:      Non-bleeding internal hemorrhoids were found during retroflexion.      Many large-mouthed and small-mouthed diverticula were found in the       sigmoid colon and descending colon.      The terminal ileum appeared normal.      There is no endoscopic evidence or stigmata of bleeding in the entire       colon. Impression:               - Non-bleeding internal hemorrhoids.                           - Diverticulosis in the sigmoid colon, in the                            descending colon and in the transverse colon.                           - The examined portion of the ileum was normal.                           - No specimens collected. Moderate Sedation:      Per Anesthesia Care Recommendation:           - Return patient to hospital ward for ongoing care.                           - Cardiac diet.                           - Use fiber, for example Citrucel, Fibercon, [employer]  or Metamucil.                           - Okay to DC home today. Ideally would be on single                            APT agent but will defer to cardiology.                           - Patient updated, I attempted to call wife without                            answer. Message sent to her phone. Procedure Code(s):        --- Professional ---                           [phone], Colonoscopy, flexible; diagnostic, including                            collection of specimen(s) by brushing or washing,                            when performed (separate procedure) Diagnosis Code(s):        --- Professional ---                           K64.8, Other hemorrhoids                            K92.1, Melena (includes Hematochezia)                           D62, Acute posthemorrhagic anemia                           K57.30, Diverticulosis of large intestine without                            perforation or abscess without bleeding CPT copyright 2022 American Medical Association. All rights reserved. The codes documented in this report are preliminary and upon coder review may  be revised to meet current compliance requirements. Hennie Duos. Marletta Lor, DO Hennie Duos. Marletta Lor, DO 08/29/2022 10:00:09 AM This report has been signed electronically. Number of Addenda: 0

## 2022-08-29 NOTE — Interval H&P Note (Signed)
History and Physical Interval Note:  08/29/2022 9:02 AM  Jeremy Jenkins  has presented today for surgery, with the diagnosis of Acute blood loss anemia, Hematochezia.  The various methods of treatment have been discussed with the patient and family. After consideration of risks, benefits and other options for treatment, the patient has consented to  Procedure(s): ESOPHAGOGASTRODUODENOSCOPY (EGD) WITH PROPOFOL (N/A) COLONOSCOPY WITH PROPOFOL (N/A) as a surgical intervention.  The patient's history has been reviewed, patient examined, no change in status, stable for surgery.  I have reviewed the patient's chart and labs.  Questions were answered to the patient's satisfaction.     Lanelle Bal

## 2022-08-29 NOTE — Plan of Care (Signed)
  Problem: Education: Goal: Knowledge of General Education information will improve Description: Including pain rating scale, medication(s)/side effects and non-pharmacologic comfort measures 08/29/2022 1120 by Sheela Stack, RN Outcome: Adequate for Discharge 08/29/2022 0759 by Sheela Stack, RN Outcome: Progressing   Problem: Health Behavior/Discharge Planning: Goal: Ability to manage health-related needs will improve 08/29/2022 1120 by Sheela Stack, RN Outcome: Adequate for Discharge 08/29/2022 0759 by Sheela Stack, RN Outcome: Progressing   Problem: Clinical Measurements: Goal: Ability to maintain clinical measurements within normal limits will improve 08/29/2022 1120 by Sheela Stack, RN Outcome: Adequate for Discharge 08/29/2022 0759 by Sheela Stack, RN Outcome: Progressing Goal: Will remain free from infection 08/29/2022 1120 by Sheela Stack, RN Outcome: Adequate for Discharge 08/29/2022 0759 by Sheela Stack, RN Outcome: Progressing Goal: Diagnostic test results will improve 08/29/2022 1120 by Sheela Stack, RN Outcome: Adequate for Discharge 08/29/2022 0759 by Sheela Stack, RN Outcome: Progressing Goal: Respiratory complications will improve 08/29/2022 1120 by Sheela Stack, RN Outcome: Adequate for Discharge 08/29/2022 0759 by Sheela Stack, RN Outcome: Progressing Goal: Cardiovascular complication will be avoided 08/29/2022 1120 by Sheela Stack, RN Outcome: Adequate for Discharge 08/29/2022 0759 by Sheela Stack, RN Outcome: Progressing   Problem: Activity: Goal: Risk for activity intolerance will decrease 08/29/2022 1120 by Sheela Stack, RN Outcome: Adequate for Discharge 08/29/2022 0759 by Sheela Stack, RN Outcome: Progressing   Problem: Nutrition: Goal: Adequate nutrition will be maintained 08/29/2022 1120 by Sheela Stack, RN Outcome: Adequate for Discharge 08/29/2022 0759 by Sheela Stack, RN Outcome: Progressing    Problem: Coping: Goal: Level of anxiety will decrease 08/29/2022 1120 by Sheela Stack, RN Outcome: Adequate for Discharge 08/29/2022 0759 by Sheela Stack, RN Outcome: Progressing   Problem: Elimination: Goal: Will not experience complications related to bowel motility 08/29/2022 1120 by Sheela Stack, RN Outcome: Adequate for Discharge 08/29/2022 0759 by Sheela Stack, RN Outcome: Progressing Goal: Will not experience complications related to urinary retention 08/29/2022 1120 by Sheela Stack, RN Outcome: Adequate for Discharge 08/29/2022 0759 by Sheela Stack, RN Outcome: Progressing   Problem: Pain Managment: Goal: General experience of comfort will improve 08/29/2022 1120 by Sheela Stack, RN Outcome: Adequate for Discharge 08/29/2022 0759 by Sheela Stack, RN Outcome: Progressing   Problem: Safety: Goal: Ability to remain free from injury will improve 08/29/2022 1120 by Sheela Stack, RN Outcome: Adequate for Discharge 08/29/2022 0759 by Sheela Stack, RN Outcome: Progressing   Problem: Skin Integrity: Goal: Risk for impaired skin integrity will decrease 08/29/2022 1120 by Sheela Stack, RN Outcome: Adequate for Discharge 08/29/2022 0759 by Sheela Stack, RN Outcome: Progressing

## 2022-08-29 NOTE — Plan of Care (Signed)

## 2022-08-29 NOTE — Anesthesia Preprocedure Evaluation (Addendum)
Anesthesia Evaluation  Patient identified by MRN, date of birth, ID band Patient awake    Reviewed: Allergy & Precautions, H&P , NPO status , Patient's Chart, lab work & pertinent test results  Airway Mallampati: II  TM Distance: >3 FB Neck ROM: Full    Dental  (+) Edentulous Upper, Edentulous Lower   Pulmonary former smoker   Pulmonary exam normal breath sounds clear to auscultation       Cardiovascular Exercise Tolerance: Good hypertension, Pt. on medications + CAD, + Past MI, + Cardiac Stents (04/2021) and + Peripheral Vascular Disease (AAA)  Normal cardiovascular exam Rhythm:Regular Rate:Normal   1. Left ventricular ejection fraction, by estimation, is 55 to 60%. The  left ventricle has normal function. The left ventricle has no regional  wall motion abnormalities. Left ventricular diastolic parameters were  normal.   2. Right ventricular systolic function is normal. The right ventricular  size is normal. There is normal pulmonary artery systolic pressure. The  estimated right ventricular systolic pressure is 32.2 mmHg.   3. The mitral valve is grossly normal. No evidence of mitral valve  regurgitation. No evidence of mitral stenosis.   4. The aortic valve is tricuspid. Aortic valve regurgitation is trivial.  No aortic stenosis is present.   5. The inferior vena cava is normal in size with <50% respiratory  variability, suggesting right atrial pressure of 8 mmHg.     Neuro/Psych  Headaches PSYCHIATRIC DISORDERS Anxiety        GI/Hepatic ,GERD  Medicated,,(+)     substance abuse  alcohol use  Endo/Other  negative endocrine ROS    Renal/GU negative Renal ROS  negative genitourinary   Musculoskeletal  (+) Arthritis , Osteoarthritis,  BACK PAIN   Abdominal   Peds negative pediatric ROS (+)  Hematology  (+) Blood dyscrasia, anemia   Anesthesia Other Findings   Reproductive/Obstetrics negative OB ROS                              Anesthesia Physical Anesthesia Plan  ASA: 3  Anesthesia Plan: General   Post-op Pain Management: Minimal or no pain anticipated   Induction: Intravenous  PONV Risk Score and Plan: 1 and Propofol infusion  Airway Management Planned: Nasal Cannula and Natural Airway  Additional Equipment:   Intra-op Plan:   Post-operative Plan:   Informed Consent: I have reviewed the patients History and Physical, chart, labs and discussed the procedure including the risks, benefits and alternatives for the proposed anesthesia with the patient or authorized representative who has indicated his/her understanding and acceptance.     Dental advisory given  Plan Discussed with: CRNA and Surgeon  Anesthesia Plan Comments:         Anesthesia Quick Evaluation

## 2022-08-29 NOTE — Progress Notes (Signed)
PROGRESS NOTE    Patient: Jeremy Jenkins                            PCP: Anabel Halon, MD                    DOB: 04-22-1950            DOA: 08/27/2022 [id number]             DOS: 08/29/2022, 9:07 AM   LOS: 2 days   Date of Service: The patient was seen and examined on 08/29/2022  Subjective:   The patient was seen and examined this morning. Hemodynamically stable. No issues overnight . Wake alert in no acute distress  Brief Narrative:   Jeremy Jenkins  is a 72 y.o. male, with past medical history of AAA, CAD status post PCI in 2013 and 2023, diverticulosis, hypertension, hyperlipidemia, patient presents to ED secondary to home plaints of dark stools/melena, patient was seen by GI earlier today, describes dark blood per rectum, occurring 4-5 times a day, intermittent, no nausea, no vomiting, has any NSAIDs use, reports using Plavix most recent today due to history of stents, reports his hemoglobin has dropped to 8.9 during GI visit today which brought with him to come to ED, he denies any chest pain, shortness of breath, no abdominal pain. ED: Workup significant for stable creatinine at 1.1, hemoglobin dropped at 8.7, was 12.6 only 3 days ago , platelets stable at 204,  Hemoccult positive.      Assessment & Plan:     Principal Problem:   GI bleed Active Problems:   Benign essential hypertension   Coronary artery disease   Gastro-esophageal reflux disease without esophagitis   S/P insertion of non-drug eluting coronary artery stent   Acute blood loss anemia / GI bleed -Reporting no further rectal bleed or melena -Patient presents with melena, - significant drop in hemoglobin from 12.5>8.7,     Latest Ref Rng & Units 08/29/2022    4:08 AM 08/28/2022    5:07 PM 08/28/2022    7:54 AM  CBC  WBC 4.0 - 10.5 K/uL 8.1  9.6    Hemoglobin 13.0 - 17.0 g/dL 9.1  9.8  9.3   Hematocrit 39.0 - 52.0 % 28.9  30.5  28.6   Platelets 150 - 400 K/uL 197  212      - colonoscopy  in the past showing evidence of diverticulosis and internal hemorrhoids -Hold Plavix and start  aspirin instead per gi recommendation (patient had PCI done and 06/2021, no indication for dual antiplatelet therapy at this point. -Given history of CAD, S/p 1 unit PRBC   -monitor CBC closely and transfuse as needed  -Continue with Protonix drip  -N.p.o.  -GI consulted: Planning for colonoscopy/ EGD 12/30/2022      NSTEMI, history of CAD with stenting and 06/2021 -Continue with aspirin, statin, beta-blockers -Transfuse manage PRBC to keep hemoglobin around 10 - D/C Plavix  Pretension -Continue with metoprolol   Hyperlipidemia -Continue with home dose statin   AAA:  -Patient was following with Dr. Arbie Cookey as an outpatient   History of ETOH use: -Reports last drink was 8 days ago, reports he only drink 1 to twice a week          ----------------------------------------------------------------------------------------------------------------------------------------------- Nutritional status:  The patient's BMI is: Body mass index is 28.55 kg/m. I agree with the assessment and plan as outlined ----------------------------------------------------------------------------------------------------------------------  DVT prophylaxis:  SCDs Start: 08/27/22 2105   Code Status:   Code Status: Full Code  Family Communication: No family member present at bedside- attempt will be made to update daily The above findings and plan of care has been discussed with patient (and family)  in detail,  they expressed understanding and agreement of above. -Advance care planning has been discussed.   Admission status:   Status is: Inpatient Remains inpatient appropriate because: Needing GI workup, possible endoscopy for GI bleed   Disposition: From  - home             Planning for discharge in 1-2 days: to   Procedures:   No admission procedures for hospital encounter.   Antimicrobials:   Anti-infectives (From admission, onward)    None        Medication:   [MAR Hold] sodium chloride   Intravenous Once   [MAR Hold] fluticasone  1 spray Each Nare Daily   [MAR Hold] loratadine  10 mg Oral Daily   [MAR Hold] melatonin  9 mg Oral QHS   [MAR Hold] metoprolol succinate  50 mg Oral Daily   [MAR Hold] multivitamin with minerals  1 tablet Oral Daily   [MAR Hold] omega-3 acid ethyl esters  1 g Oral BID   [MAR Hold] pantoprazole  40 mg Intravenous Q12H   [MAR Hold] rosuvastatin  40 mg Oral Daily   [MAR Hold] traZODone  50 mg Oral QHS    [MAR Hold] acetaminophen **OR** [MAR Hold] acetaminophen, [MAR Hold] hydrALAZINE, [MAR Hold] loperamide, [MAR Hold] nitroGLYCERIN, [MAR Hold] oxyCODONE   Objective:   Vitals:   08/28/22 1405 08/28/22 2052 08/29/22 0357 08/29/22 0900  BP: 114/78 (!) 126/92 115/68 (!) 118/98  Pulse: 79 73 71 80  Resp: 18 18 16    Temp: 98.6 F (37 C) 98 F (36.7 C) 98 F (36.7 C) 97.8 F (36.6 C)  TempSrc: Oral   Oral  SpO2: 98% 98% 97% 98%  Weight:      Height:        Intake/Output Summary (Last 24 hours) at 08/29/2022 0907 Last data filed at 08/29/2022 1610 Gross per 24 hour  Intake 270 ml  Output --  Net 270 ml   Filed Weights   08/27/22 1413  Weight: 90.3 kg     Physical examination:       General:  AAO x 3,  cooperative, no distress;   HEENT:  Normocephalic, PERRL, otherwise with in Normal limits   Neuro:  CNII-XII intact. , normal motor and sensation, reflexes intact   Lungs:   Clear to auscultation BL, Respirations unlabored,  No wheezes / crackles  Cardio:    S1/S2, RRR, No murmure, No Rubs or Gallops   Abdomen:  Soft, non-tender, bowel sounds active all four quadrants, no guarding or peritoneal signs.  Muscular  skeletal:  Limited exam -global generalized weaknesses - in bed, able to move all 4 extremities,   2+ pulses,  symmetric, No pitting edema  Skin:  Dry, warm to touch, negative for any Rashes,  Wounds: Please  see nursing documentation          ------------------------------------------------------------------------------------------------------------------------------------------    LABs:     Latest Ref Rng & Units 08/29/2022    4:08 AM 08/28/2022    5:07 PM 08/28/2022    7:54 AM  CBC  WBC 4.0 - 10.5 K/uL 8.1  9.6    Hemoglobin 13.0 - 17.0 g/dL 9.1  9.8  9.3   Hematocrit 39.0 -  52.0 % 28.9  30.5  28.6   Platelets 150 - 400 K/uL 197  212        Latest Ref Rng & Units 08/28/2022    4:12 AM 08/27/2022    3:08 PM 03/19/2022   11:43 AM  CMP  Glucose 70 - 99 mg/dL 147  829  562   BUN 8 - 23 mg/dL 17  19  15    Creatinine 0.61 - 1.24 mg/dL 1.30  8.65  7.84   Sodium 135 - 145 mmol/L 136  135  141   Potassium 3.5 - 5.1 mmol/L 3.5  4.1  5.0   Chloride 98 - 111 mmol/L 104  101  105   CO2 22 - 32 mmol/L 23  25  22    Calcium 8.9 - 10.3 mg/dL 7.7  8.4  8.8   Total Protein 6.5 - 8.1 g/dL  6.5  6.7   Total Bilirubin 0.3 - 1.2 mg/dL  0.7  0.5   Alkaline Phos 38 - 126 U/L  48  69   AST 15 - 41 U/L  16  15   ALT 0 - 44 U/L  9  6        Micro Results No results found for this or any previous visit (from the past 240 hour(s)).  Radiology Reports No results found.  SIGNED: Kendell Bane, MD, FHM. FAAFP. [facility] - [facility] Time spent - 50 min.  In seeing, evaluating and examining the patient. Reviewing medical records, labs, drawn plan of care. [facility],  Pager (please use [email or web address] to page/ text) Please use Epic Secure Chat for non-urgent communication (7AM-7PM)  If 7PM-7AM, please contact night-coverage [email or web address], 08/29/2022, 9:07 AM

## 2022-08-29 NOTE — Progress Notes (Signed)
No complaints throughout night. Pt rested well. Tap water enema given this am. Pt remain NPO since midnight.

## 2022-08-29 NOTE — Transfer of Care (Signed)
Immediate Anesthesia Transfer of Care Note  Patient: Jeremy Jenkins  Procedure(s) Performed: ESOPHAGOGASTRODUODENOSCOPY (EGD) WITH PROPOFOL COLONOSCOPY WITH PROPOFOL  Patient Location: PACU  Anesthesia Type:General  Level of Consciousness: awake, alert , oriented, and sedated  Airway & Oxygen Therapy: Patient Spontanous Breathing and Patient connected to nasal cannula oxygen  Post-op Assessment: Report given to RN and Post -op Vital signs reviewed and stable  Post vital signs: Reviewed and stable  Last Vitals:  Vitals Value Taken Time  BP 111/63 08/29/22 0945  Temp 98.1 08/29/22   0945  Pulse 73 08/29/22 0946  Resp 14 08/29/22 0946  SpO2 98 % 08/29/22 0946  Vitals shown include unvalidated device data.  Last Pain:  Vitals:   08/29/22 0900  TempSrc: Oral  PainSc: 0-No pain      Patients Stated Pain Goal: 7 (08/29/22 0859)  Complications: No notable events documented.

## 2022-08-29 NOTE — Discharge Summary (Signed)
Physician Discharge Summary   Patient: Jeremy Jenkins MRN: [id number] DOB: 11/13/1950  Admit date:     08/27/2022  Discharge date: 08/29/22  Discharge Physician: Kendell Bane   PCP: Anabel Halon, MD   Recommendations at discharge:    Follow-up with audiologist in 1-2 weeks Follow-up with gastroenterologist in 2 weeks Follow with the PCP in 1-2 week For now discontinued Plavix, initiated aspirin till seen and evaluated by cardiologist  Discharge Diagnoses: Principal Problem:   GI bleed Active Problems:   Benign essential hypertension   Coronary artery disease   Gastro-esophageal reflux disease without esophagitis   S/P insertion of non-drug eluting coronary artery stent   Hematochezia   ABLA (acute blood loss anemia)  Resolved Problems:   * No resolved hospital problems. *  Hospital Course: [name]  is a 72 y.o. male, with past medical history of AAA, CAD status post PCI in 2013 and 2023, diverticulosis, hypertension, hyperlipidemia, patient presents to ED secondary to home plaints of dark stools/melena, patient was seen by GI earlier today, describes dark blood per rectum, occurring 4-5 times a day, intermittent, no nausea, no vomiting, has any NSAIDs use, reports using Plavix most recent today due to history of stents, reports his hemoglobin has dropped to 8.9 during GI visit today which brought with him to come to ED, he denies any chest pain, shortness of breath, no abdominal pain. ED: Workup significant for stable creatinine at 1.1, hemoglobin dropped at 8.7, was 12.6 only 3 days ago , platelets stable at 204,  Hemoccult positive.    Acute blood loss anemia / GI bleed -Reporting no further rectal bleed or melena -Patient presents with melena, - significant drop in hemoglobin from 12.5>8.7,     Latest Ref Rng & Units 08/29/2022    4:08 AM 08/28/2022    5:07 PM 08/28/2022    7:54 AM  CBC  WBC 4.0 - 10.5 K/uL 8.1  9.6    Hemoglobin 13.0 - 17.0 g/dL 9.1   9.8  9.3   Hematocrit 39.0 - 52.0 % 28.9  30.5  28.6   Platelets 150 - 400 K/uL 197  212      - colonoscopy in the past showing evidence of diverticulosis and internal hemorrhoids -Hold Plavix and start  aspirin instead per gi recommendation (patient had PCI done and 06/2021, no indication for dual antiplatelet therapy at this point. -Given history of CAD, S/p 1 unit PRBC   -monitor CBC closely and transfuse as needed  -N.p.o. Protonix   -GI consulted: Planning for colonoscopy/ EGD 12/30/2022 No active signs of bleeding was identified - Non-bleeding internal hemorrhoid, diverticulosis throughout descending colon and transverse colon,     NSTEMI, history of CAD with stenting and 06/2021 -Continue with aspirin, statin, beta-blockers -Transfuse manage PRBC to keep hemoglobin around 10 - D/C Plavix - -initiated aspirin  Pretension -Continue with metoprolol   Hyperlipidemia -Continue with home dose statin   AAA:  -Patient was following with Dr. Arbie Cookey as an outpatient   History of ETOH use: -Reports last drink was 8 days ago, reports he only drink 1 to twice a week       Consultants: Gastroenterologist Procedures performed: EGD/colonoscopy Disposition: Home Diet recommendation:  Discharge Diet Orders (From admission, onward)     Start     Ordered   08/29/22 0000  Diet - low sodium heart healthy        08/29/22 1046  Cardiac diet DISCHARGE MEDICATION: Allergies as of 08/29/2022       Reactions   Albuterol Anaphylaxis   Azithromycin Tinitus   Hearing loss   Penicillins Shortness Of Breath   Tadalafil Shortness Of Breath   Aspartame And Phenylalanine Other (See Comments)   HEADACHES   Topiramate Other (See Comments)   Memory loss   Buspirone Other (See Comments)   Memory loss   Hydrocodone Other (See Comments)   Tingling all over        Medication List     STOP taking these medications    clopidogrel 75 MG tablet Commonly known as:  PLAVIX       TAKE these medications    aspirin EC 325 MG tablet Take 1 tablet (325 mg total) by mouth daily. Start taking on: Aug 30, 2022   Azelastine HCl 137 MCG/SPRAY Soln USE 2 SPRAY(S) IN EACH NOSTRIL TWICE DAILY AS DIRECTED What changed: See the new instructions.   CO Q 10 PO Take by mouth.   Fish Oil 1000 MG Cpdr Take 1 tablet by mouth 2 (two) times daily.   fluticasone 50 MCG/ACT nasal spray Commonly known as: FLONASE Place 1 spray into both nostrils daily.   iron polysaccharides 150 MG capsule Commonly known as: Nu-Iron Take 1 capsule (150 mg total) by mouth daily.   levocetirizine 5 MG tablet Commonly known as: XYZAL TAKE 1 TABLET BY MOUTH ONCE DAILY IN THE EVENING   loperamide 2 MG capsule Commonly known as: IMODIUM Take 2 mg by mouth as needed for diarrhea or loose stools.   Melatonin 10 MG Tabs Take 10 mg by mouth at bedtime.   metoprolol succinate 50 MG 24 hr tablet Commonly known as: TOPROL-XL Take 1 tablet (50 mg total) by mouth daily. Take with or immediately following a meal.   nitroGLYCERIN 0.4 MG SL tablet Commonly known as: NITROSTAT Place 1 tablet (0.4 mg total) under the tongue every 5 (five) minutes as needed for chest pain.   pantoprazole 40 MG tablet Commonly known as: PROTONIX Take 1 tablet (40 mg total) by mouth 2 (two) times daily.   Quintabs Tabs Take 1 tablet by mouth daily.   rosuvastatin 40 MG tablet Commonly known as: CRESTOR Take 1 tablet (40 mg total) by mouth daily.   traZODone 50 MG tablet Commonly known as: DESYREL Take 1 tablet (50 mg total) by mouth at bedtime.   VITAMIN K2-VITAMIN D3 PO Take by mouth.        Discharge Exam: Filed Weights   08/27/22 1413  Weight: 90.3 kg        General:  AAO x 3,  cooperative, no distress;   HEENT:  Normocephalic, PERRL, otherwise with in Normal limits   Neuro:  CNII-XII intact. , normal motor and sensation, reflexes intact   Lungs:   Clear to auscultation BL,  Respirations unlabored,  No wheezes / crackles  Cardio:    S1/S2, RRR, No murmure, No Rubs or Gallops   Abdomen:  Soft, non-tender, bowel sounds active all four quadrants, no guarding or peritoneal signs.  Muscular  skeletal:  Limited exam -global generalized weaknesses - in bed, able to move all 4 extremities,   2+ pulses,  symmetric, No pitting edema  Skin:  Dry, warm to touch, negative for any Rashes,  Wounds: Please see nursing documentation          Condition at discharge: good  The results of significant diagnostics from this hospitalization (including imaging, microbiology, ancillary and laboratory)  are listed below for reference.   Imaging Studies: No results found.  Microbiology: Results for orders placed or performed during the hospital encounter of 05/18/21  Resp Panel by RT-PCR (Flu A&B, Covid) Nasopharyngeal Swab     Status: None   Collection Time: 05/18/21 10:59 PM   Specimen: Nasopharyngeal Swab; Nasopharyngeal(NP) swabs in vial transport medium  Result Value Ref Range Status   SARS Coronavirus 2 by RT PCR NEGATIVE NEGATIVE Final    Comment: (NOTE) SARS-CoV-2 target nucleic acids are NOT DETECTED.  The SARS-CoV-2 RNA is generally detectable in upper respiratory specimens during the acute phase of infection. The lowest concentration of SARS-CoV-2 viral copies this assay can detect is 138 copies/mL. A negative result does not preclude SARS-Cov-2 infection and should not be used as the sole basis for treatment or other patient management decisions. A negative result may occur with  improper specimen collection/handling, submission of specimen other than nasopharyngeal swab, presence of viral mutation(s) within the areas targeted by this assay, and inadequate number of viral copies(<138 copies/mL). A negative result must be combined with clinical observations, patient history, and epidemiological information. The expected result is Negative.  Fact Sheet for  Patients:  BloggerCourse.com  Fact Sheet for Healthcare Providers:  SeriousBroker.it  This test is no t yet approved or cleared by the [place] FDA and  has been authorized for detection and/or diagnosis of SARS-CoV-2 by FDA under an Emergency Use Authorization (EUA). This EUA will remain  in effect (meaning this test can be used) for the duration of the COVID-19 declaration under Section 564(b)(1) of the Act, 21 U.S.C.section 360bbb-3(b)(1), unless the authorization is terminated  or revoked sooner.       Influenza A by PCR NEGATIVE NEGATIVE Final   Influenza B by PCR NEGATIVE NEGATIVE Final    Comment: (NOTE) The Xpert Xpress SARS-CoV-2/FLU/RSV plus assay is intended as an aid in the diagnosis of influenza from Nasopharyngeal swab specimens and should not be used as a sole basis for treatment. Nasal washings and aspirates are unacceptable for Xpert Xpress SARS-CoV-2/FLU/RSV testing.  Fact Sheet for Patients: BloggerCourse.com  Fact Sheet for Healthcare Providers: SeriousBroker.it  This test is not yet approved or cleared by the [place] FDA and has been authorized for detection and/or diagnosis of SARS-CoV-2 by FDA under an Emergency Use Authorization (EUA). This EUA will remain in effect (meaning this test can be used) for the duration of the COVID-19 declaration under Section 564(b)(1) of the Act, 21 U.S.C. section 360bbb-3(b)(1), unless the authorization is terminated or revoked.  Performed at [facility], [address], [place], [place] [address]   Surgical PCR screen     Status: None   Collection Time: 05/19/21 12:14 PM   Specimen: Nasal Mucosa; Nasal Swab  Result Value Ref Range Status   MRSA, PCR NEGATIVE NEGATIVE Final   Staphylococcus aureus NEGATIVE NEGATIVE Final    Comment: (NOTE) The Xpert SA Assay (FDA approved for NASAL specimens in patients  52 years of age and older), is one component of a comprehensive surveillance program. It is not intended to diagnose infection nor to guide or monitor treatment. Performed at [facility], [address] [place], [place] [address]     Labs: CBC: Recent Labs  Lab 08/24/22 1044 08/27/22 1141 08/27/22 1141 08/27/22 1508 08/28/22 0126 08/28/22 0412 08/28/22 0754 08/28/22 1707 08/29/22 0408  WBC 10.8 12.5*   < > 11.9* 9.4 8.4  --  9.6 8.1  NEUTROABS 7.3* 8.8*  --  7.7  --   --   --   --   --   HGB 12.6* 8.9*   < > 8.7* 8.6* 8.8* 9.3* 9.8* 9.1*  HCT 38.9 27.6*   < > 27.2* 26.9* 26.7* 28.6* 30.5* 28.9*  MCV 89 90.2   < > 91.0 90.3 89.3  --  90.5 92.0  PLT 188 202   < > 204 169 172  --  212 197   < > = values in this interval not displayed.   Basic Metabolic Panel: Recent Labs  Lab 08/27/22 1508 08/28/22 0412  NA 135 136  K 4.1 3.5  CL 101 104  CO2 25 23  GLUCOSE 152* 119*  BUN 19 17  CREATININE 1.18 1.04  CALCIUM 8.4* 7.7*   Liver Function Tests: Recent Labs  Lab 08/27/22 1508  AST 16  ALT 9  ALKPHOS 48  BILITOT 0.7  PROT 6.5  ALBUMIN 3.5   CBG: No results for input(s): "GLUCAP" in the last 168 hours.  Discharge time spent: greater than 40 minutes.  Signed: Kendell Bane, MD [facility] 08/29/2022

## 2022-08-29 NOTE — Anesthesia Postprocedure Evaluation (Signed)
Anesthesia Post Note  Patient: Jeremy Jenkins  Procedure(s) Performed: ESOPHAGOGASTRODUODENOSCOPY (EGD) WITH PROPOFOL COLONOSCOPY WITH PROPOFOL  Patient location during evaluation: PACU Anesthesia Type: General Level of consciousness: awake and alert and oriented Pain management: pain level controlled Vital Signs Assessment: post-procedure vital signs reviewed and stable Respiratory status: spontaneous breathing, nonlabored ventilation and respiratory function stable Cardiovascular status: blood pressure returned to baseline and stable Postop Assessment: no apparent nausea or vomiting Anesthetic complications: no  No notable events documented.   Last Vitals:  Vitals:   08/29/22 0900 08/29/22 0942  BP: (!) 118/98 111/63  Pulse: 80 74  Resp:  15  Temp: 36.6 C 36.7 C  SpO2: 98% 100%    Last Pain:  Vitals:   08/29/22 0942  TempSrc:   PainSc: 0-No pain                 Markelle Asaro C Tanica Gaige

## 2022-08-31 ENCOUNTER — Encounter (HOSPITAL_COMMUNITY): Admit: 2022-08-31 | Payer: Medicare HMO

## 2022-08-31 LAB — TYPE AND SCREEN
ABO/RH(D): A POS
Antibody Screen: NEGATIVE
Unit division: 0
Unit division: 0

## 2022-08-31 LAB — BPAM RBC
ISSUE DATE / TIME: 202405242106
ISSUE DATE / TIME: 202405242106
Unit Type and Rh: 6200

## 2022-09-01 ENCOUNTER — Encounter: Payer: Self-pay | Admitting: *Deleted

## 2022-09-01 ENCOUNTER — Telehealth: Payer: Self-pay | Admitting: *Deleted

## 2022-09-01 NOTE — Transitions of Care (Post Inpatient/ED Visit) (Signed)
09/01/2022  Name: Jeremy Jenkins MRN: [id number] DOB: 1950-06-06  Today's TOC FU Call Status: Today's TOC FU Call Status:: Successful TOC FU Call Competed TOC FU Call Complete Date: 09/01/22  Transition Care Management Follow-up Telephone Call Date of Discharge: 08/29/22 [facility] Type of Discharge: Inpatient Admission Primary Inpatient Discharge Diagnosis:: GI Bleed How have you been since you were released from the hospital?: Better ("I feel great") Any questions or concerns?: No  Items Reviewed: Did you receive and understand the discharge instructions provided?: Yes Medications obtained,verified, and reconciled?: Yes (Medications Reviewed) (d/c plavix and started ASA) Any new allergies since your discharge?: No Dietary orders reviewed?: Yes Type of Diet Ordered:: Low sodium heart healthy Do you have support at home?: Yes People in Home: spouse Name of Support/Comfort Primary Source: Lyla Son  Medications Reviewed Today: Medications Reviewed Today     Reviewed by Gwenith Daily, RN (Registered Nurse) on 09/01/22 at 1012  Med List Status: <None>   Medication Order Taking? Sig Documenting Provider Last Dose Status Informant  aspirin EC 325 MG tablet [id number] Yes Take 1 tablet (325 mg total) by mouth daily. Kendell Bane, MD Taking Active   Azelastine HCl 137 MCG/SPRAY SOLN [id number] Yes USE 2 SPRAY(S) IN EACH NOSTRIL TWICE DAILY AS DIRECTED  Patient taking differently: Inhale 2 sprays into the lungs daily.   Anabel Halon, MD Taking Active Pharmacy Records, Self  Coenzyme Q10 (CO Q 10 PO) [id number] Yes Take by mouth. [provider] Taking Active Self, Pharmacy Records  fluticasone [facility]) 50 MCG/ACT nasal spray [id number] Yes Place 1 spray into both nostrils daily. [provider] Taking Active Self, Pharmacy Records  iron polysaccharides (NU-IRON) 150 MG capsule [id number] Yes Take 1 capsule (150 mg total) by mouth daily.  Gardenia Phlegm, MD Taking Active Pharmacy Records, Self           Med Note Ephriam Knuckles, [facility]   Fri Aug 27, 2022 10:36 AM) Hasn't picked up from pharmacy  levocetirizine (XYZAL) 5 MG tablet [id number] Yes TAKE 1 TABLET BY MOUTH ONCE DAILY IN THE Jaye Beagle, MD Taking Active Pharmacy Records, Self  loperamide (IMODIUM) 2 MG capsule [id number] Yes Take 2 mg by mouth as needed for diarrhea or loose stools. [provider] Taking Active Self, Pharmacy Records  Melatonin 10 MG TABS [id number] Yes Take 10 mg by mouth at bedtime. [provider] Taking Active Pharmacy Records, Self  metoprolol succinate (TOPROL-XL) 50 MG 24 hr tablet [id number] Yes Take 1 tablet (50 mg total) by mouth daily. Take with or immediately following a meal. Anabel Halon, MD Taking Active Pharmacy Records, Self  Multiple Vitamin Brandt Loosen) TABS [id number] Yes Take 1 tablet by mouth daily. [provider] Taking Active Pharmacy Records, Self  nitroGLYCERIN (NITROSTAT) 0.4 MG SL tablet [id number] Yes Place 1 tablet (0.4 mg total) under the tongue every 5 (five) minutes as needed for chest pain. Arty Baumgartner, NP Taking Active Pharmacy Records, Self  Omega-3 Fatty Acids (FISH OIL) 1000 MG CPDR [id number] Yes Take 1 tablet by mouth 2 (two) times daily. [provider] Taking Active Self, Pharmacy Records  pantoprazole (PROTONIX) 40 MG tablet [id number] Yes Take 1 tablet (40 mg total) by mouth 2 (two) times daily. Antoine Poche, MD Taking Active Pharmacy Records, Self  rosuvastatin (CRESTOR) 40 MG tablet [id number] Yes Take 1 tablet (40 mg total) by mouth daily. Antoine Poche, MD Taking Active Pharmacy Records, Self  traZODone [facility])  50 MG tablet [id number] Yes Take 1 tablet (50 mg total) by mouth at bedtime. Anabel Halon, MD Taking Active Pharmacy Records, Self  Vitamin D-Vitamin K (VITAMIN K2-VITAMIN D3 PO) [id number] Yes Take by mouth. [provider] Taking Active  Self, Pharmacy Records            Home Care and Equipment/Supplies: Were Home Health Services Ordered?: No Any new equipment or medical supplies ordered?: No  Functional Questionnaire: Do you need assistance with bathing/showering or dressing?: No Do you need assistance with meal preparation?: No Do you need assistance with eating?: No Do you have difficulty maintaining continence: No Do you need assistance with getting out of bed/getting out of a chair/moving?: No Do you have difficulty managing or taking your medications?: No  Follow up appointments reviewed: PCP Follow-up appointment confirmed?: Yes Date of PCP follow-up appointment?: 09/10/22 Follow-up Provider: Henreitta Leber, FNP Specialist Hospital Follow-up appointment confirmed?: Yes Date of Specialist follow-up appointment?: 09/20/22 Follow-Up Specialty Provider:: Sharlene Dory, NP (Cardio) (GI appt requested) Do you need transportation to your follow-up appointment?: No Do you understand care options if your condition(s) worsen?: Yes-patient verbalized understanding  SDOH Interventions Today    Flowsheet Row Most Recent Value  SDOH Interventions   Food Insecurity Interventions Intervention Not Indicated  Transportation Interventions Intervention Not Indicated  Financial Strain Interventions Intervention Not Indicated      Interventions Today    Flowsheet Row Most Recent Value  Chronic Disease   Chronic disease during today's visit Other  [Hospitalization for GI Bleed]  General Interventions   General Interventions Discussed/Reviewed General Interventions Discussed, General Interventions Reviewed, Doctor Visits, Labs, Communication with  Doctor Visits Discussed/Reviewed Doctor Visits Discussed, Doctor Visits Reviewed, Specialist, PCP  PCP/Specialist Visits Compliance with follow-up visit  Communication with PCP/Specialists  [staff messages sent to [facility] re: need for f/u appt and [facility] re: need for f/u app]      TOC Interventions Today    Flowsheet Row Most Recent Value  TOC Interventions   TOC Interventions Discussed/Reviewed TOC Interventions Discussed, TOC Interventions Reviewed, Arranged PCP follow up less than 12 days/Care Guide scheduled      Demetrios Loll, BSN, RN-BC RN Care Coordinator [facility]  [facility] Direct Dial: [phone] Main #: [phone]

## 2022-09-02 ENCOUNTER — Encounter (HOSPITAL_COMMUNITY): Admission: RE | Payer: Self-pay | Source: Home / Self Care

## 2022-09-02 ENCOUNTER — Ambulatory Visit (HOSPITAL_COMMUNITY): Admission: RE | Admit: 2022-09-02 | Payer: Medicare HMO | Source: Home / Self Care

## 2022-09-02 SURGERY — COLONOSCOPY WITH PROPOFOL
Anesthesia: Monitor Anesthesia Care

## 2022-09-03 ENCOUNTER — Encounter (HOSPITAL_COMMUNITY): Payer: Self-pay | Admitting: Internal Medicine

## 2022-09-03 ENCOUNTER — Other Ambulatory Visit: Payer: Self-pay | Admitting: Internal Medicine

## 2022-09-03 DIAGNOSIS — J309 Allergic rhinitis, unspecified: Secondary | ICD-10-CM

## 2022-09-07 ENCOUNTER — Other Ambulatory Visit: Payer: Self-pay | Admitting: Internal Medicine

## 2022-09-07 DIAGNOSIS — M5136 Other intervertebral disc degeneration, lumbar region: Secondary | ICD-10-CM

## 2022-09-07 DIAGNOSIS — F5101 Primary insomnia: Secondary | ICD-10-CM

## 2022-09-10 ENCOUNTER — Encounter: Payer: Self-pay | Admitting: Family Medicine

## 2022-09-10 ENCOUNTER — Ambulatory Visit (INDEPENDENT_AMBULATORY_CARE_PROVIDER_SITE_OTHER): Payer: Medicare HMO | Admitting: Family Medicine

## 2022-09-10 VITALS — BP 122/80 | HR 76 | Ht 70.0 in | Wt 201.0 lb

## 2022-09-10 DIAGNOSIS — Z09 Encounter for follow-up examination after completed treatment for conditions other than malignant neoplasm: Secondary | ICD-10-CM | POA: Diagnosis not present

## 2022-09-10 DIAGNOSIS — K922 Gastrointestinal hemorrhage, unspecified: Secondary | ICD-10-CM

## 2022-09-10 DIAGNOSIS — Z8719 Personal history of other diseases of the digestive system: Secondary | ICD-10-CM

## 2022-09-10 NOTE — Assessment & Plan Note (Signed)
Patient discharge on 5/26 Denies, hematochezia, melena  CBC, BMP labs ordered. Patient aware GI follow up schduled 6/24

## 2022-09-10 NOTE — Progress Notes (Signed)
New Patient Office Visit   Subjective   Patient ID: Jeremy Jenkins, male    DOB: Dec 05, 1950  Age: 72 y.o. MRN: [id number]  CC:  Chief Complaint  Patient presents with   Follow-up    Patient is here for hospital f/u. No concerns or complaints.     HPI Jeremy Jenkins 72 year old male,  presents to the clinic for hospital follow up S/P GI bleed. 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   HPI    Outpatient Encounter Medications as of 09/10/2022  Medication Sig   aspirin EC 325 MG tablet Take 1 tablet (325 mg total) by mouth daily.   Azelastine HCl 137 MCG/SPRAY SOLN USE 2 SPRAY(S) IN EACH NOSTRIL TWICE DAILY AS DIRECTED (Patient taking differently: Inhale 2 sprays into the lungs daily.)   Coenzyme Q10 (CO Q 10 PO) Take by mouth.   fluticasone (FLONASE) 50 MCG/ACT nasal spray Place 1 spray into both nostrils daily.   iron polysaccharides (NU-IRON) 150 MG capsule Take 1 capsule (150 mg total) by mouth daily.   levocetirizine (XYZAL) 5 MG tablet TAKE 1 TABLET BY MOUTH ONCE DAILY IN THE EVENING   loperamide (IMODIUM) 2 MG capsule Take 2 mg by mouth as needed for diarrhea or loose stools.   Melatonin 10 MG TABS Take 10 mg by mouth at bedtime.   metoprolol succinate (TOPROL-XL) 50 MG 24 hr tablet Take 1 tablet (50 mg total) by mouth daily. Take with or immediately following a meal.   Multiple Vitamin (QUINTABS) TABS Take 1 tablet by mouth daily.   nitroGLYCERIN (NITROSTAT) 0.4 MG SL tablet Place 1 tablet (0.4 mg total) under the tongue every 5 (five) minutes as needed for chest pain.   Omega-3 Fatty Acids (FISH OIL) 1000 MG CPDR Take 1 tablet by mouth 2 (two) times daily.   pantoprazole (PROTONIX) 40 MG tablet Take 1 tablet (40 mg total) by mouth 2 (two) times daily.    rosuvastatin (CRESTOR) 40 MG tablet Take 1 tablet (40 mg total) by mouth daily.   traMADol (ULTRAM) 50 MG tablet TAKE 1 TABLET BY MOUTH EVERY 12 HOURS AS NEEDED FOR MODERATE PAIN OR SEVERE PAIN   traZODone (DESYREL) 50 MG tablet TAKE 1 TABLET BY MOUTH AT BEDTIME   Vitamin D-Vitamin K (VITAMIN K2-VITAMIN D3 PO) Take by mouth.   No facility-administered encounter medications on file as of 09/10/2022.    Past Surgical History:  Procedure Laterality Date   CARDIAC CATHETERIZATION     COLONOSCOPY WITH PROPOFOL N/A 08/29/2022   Procedure: COLONOSCOPY WITH PROPOFOL;  Surgeon: Lanelle Bal, DO;  Location: [facility] ENDO SUITE;  Service: Endoscopy;  Laterality: N/A;   CORONARY STENT INTERVENTION N/A 05/19/2021   Procedure: CORONARY STENT INTERVENTION;  Surgeon: Lyn Records, MD;  Location: [facility] INVASIVE CV LAB;  Service: Cardiovascular;  Laterality: N/A;   ESOPHAGOGASTRODUODENOSCOPY (EGD) WITH PROPOFOL N/A 08/29/2022   Procedure: ESOPHAGOGASTRODUODENOSCOPY (EGD) WITH PROPOFOL;  Surgeon: Lanelle Bal, DO;  Location: [facility] ENDO SUITE;  Service: Endoscopy;  Laterality: N/A;   LEFT HEART CATH AND CORONARY ANGIOGRAPHY N/A 05/19/2021   Procedure: LEFT HEART CATH AND CORONARY ANGIOGRAPHY;  Surgeon: Lyn Records, MD;  Location: [facility] INVASIVE CV LAB;  Service: Cardiovascular;  Laterality: N/A;    Review of Systems  Constitutional:  Negative for chills and  fever.  Eyes:  Negative for blurred vision.  Respiratory:  Negative for shortness of breath.   Cardiovascular:  Negative for chest pain.  Gastrointestinal:  Negative for abdominal pain, blood in stool, constipation, diarrhea, heartburn, melena, nausea and vomiting.  Genitourinary:  Negative for dysuria and hematuria.  Neurological:  Negative for dizziness and headaches.      Objective    BP 122/80   Pulse 76   Ht 5\' 10"  (1.778 m)   Wt 201 lb (91.2 kg)   SpO2 96%   BMI 28.84 kg/m   Physical Exam Vitals reviewed.  Constitutional:      General: He  is not in acute distress.    Appearance: Normal appearance. He is not ill-appearing, toxic-appearing or diaphoretic.  HENT:     Head: Normocephalic.  Eyes:     General:        Right eye: No discharge.        Left eye: No discharge.     Conjunctiva/sclera: Conjunctivae normal.  Cardiovascular:     Rate and Rhythm: Normal rate.     Pulses: Normal pulses.     Heart sounds: Normal heart sounds.  Pulmonary:     Effort: Pulmonary effort is normal. No respiratory distress.     Breath sounds: Normal breath sounds.  Abdominal:     General: Bowel sounds are normal.     Palpations: Abdomen is soft.     Tenderness: There is no abdominal tenderness. There is no right CVA tenderness, left CVA tenderness or guarding.  Musculoskeletal:        General: Normal range of motion.     Cervical back: Normal range of motion.  Skin:    General: Skin is warm and dry.     Capillary Refill: Capillary refill takes less than 2 seconds.  Neurological:     General: No focal deficit present.     Mental Status: He is alert and oriented to person, place, and time.     Coordination: Coordination normal.     Gait: Gait normal.  Psychiatric:        Mood and Affect: Mood normal.        Behavior: Behavior normal.       Assessment & Plan:  History of GI bleed Assessment & Plan: Patient discharge on 5/26 Denies, hematochezia, melena  CBC, BMP labs ordered. Patient aware GI follow up schduled 6/24  Orders: -     CBC with Differential/Platelet -     Basic metabolic panel    Return if symptoms worsen or fail to improve.   Cruzita Lederer Newman Nip, FNP

## 2022-09-10 NOTE — Patient Instructions (Signed)

## 2022-09-11 LAB — CBC WITH DIFFERENTIAL/PLATELET
Basophils Absolute: 0.1 10*3/uL (ref 0.0–0.2)
Basos: 1 %
EOS (ABSOLUTE): 0.5 10*3/uL — ABNORMAL HIGH (ref 0.0–0.4)
Eos: 5 %
Hematocrit: 34.2 % — ABNORMAL LOW (ref 37.5–51.0)
Hemoglobin: 10.6 g/dL — ABNORMAL LOW (ref 13.0–17.7)
Immature Grans (Abs): 0 10*3/uL (ref 0.0–0.1)
Immature Granulocytes: 0 %
Lymphocytes Absolute: 2.9 10*3/uL (ref 0.7–3.1)
Lymphs: 32 %
MCH: 27.4 pg (ref 26.6–33.0)
MCHC: 31 g/dL — ABNORMAL LOW (ref 31.5–35.7)
MCV: 88 fL (ref 79–97)
Monocytes Absolute: 0.9 10*3/uL (ref 0.1–0.9)
Monocytes: 10 %
Neutrophils Absolute: 4.6 10*3/uL (ref 1.4–7.0)
Neutrophils: 52 %
Platelets: 293 10*3/uL (ref 150–450)
RBC: 3.87 x10E6/uL — ABNORMAL LOW (ref 4.14–5.80)
RDW: 14.6 % (ref 11.6–15.4)
WBC: 9 10*3/uL (ref 3.4–10.8)

## 2022-09-11 LAB — BASIC METABOLIC PANEL
BUN/Creatinine Ratio: 12 (ref 10–24)
BUN: 13 mg/dL (ref 8–27)
CO2: 24 mmol/L (ref 20–29)
Calcium: 9 mg/dL (ref 8.6–10.2)
Chloride: 103 mmol/L (ref 96–106)
Creatinine, Ser: 1.12 mg/dL (ref 0.76–1.27)
Glucose: 128 mg/dL — ABNORMAL HIGH (ref 70–99)
Potassium: 4.9 mmol/L (ref 3.5–5.2)
Sodium: 139 mmol/L (ref 134–144)
eGFR: 70 mL/min/{1.73_m2} (ref >59)

## 2022-09-20 ENCOUNTER — Ambulatory Visit: Payer: Medicare HMO | Admitting: Nurse Practitioner

## 2022-09-27 ENCOUNTER — Ambulatory Visit: Payer: Medicare HMO | Admitting: Gastroenterology

## 2022-09-27 ENCOUNTER — Encounter: Payer: Self-pay | Admitting: Gastroenterology

## 2022-09-27 VITALS — BP 114/75 | HR 89 | Temp 99.1°F | Ht 70.0 in | Wt 201.8 lb

## 2022-09-27 DIAGNOSIS — K625 Hemorrhage of anus and rectum: Secondary | ICD-10-CM | POA: Diagnosis not present

## 2022-09-27 NOTE — Patient Instructions (Addendum)
Please update your labs in four weeks. You can go to [facility] at [facility] in [place] to complete these. We will be in touch with results within 5-7 business days.  Continue daily oral iron. Please discuss with cardiology regarding your Plavix and aspirin.  Preferably from GI standpoint, given bleeding, a single antiplatelet medication is preferred but if cardiology needs you on both, please keep [place] posted.  Call with any recurrent bleeding.

## 2022-09-27 NOTE — Progress Notes (Signed)
GI Office Note    Referring Provider: Anabel Halon, MD Primary Care Physician:  Anabel Halon, MD  Primary Gastroenterologist: Hennie Duos. Marletta Lor, DO   Chief Complaint   Chief Complaint  Patient presents with   hospitaliztion follow up    Doing well    History of Present Illness   Jeremy Jenkins is a 72 y.o. male presenting today for hospital follow-up.  Initially saw him on Aug 27, 2022 in the office for rectal bleeding.  He was passing dark red blood per rectum several times daily for about a week, passing mostly blood and very little stool.  No obvious black stool at that time.  Chronically on Plavix for history of stents with most recent stent in February 2023.  Also on aspirin daily (patient reported recurrent angina when he tried stopping aspirin and going to single APT per cardiology request).  He denied any other NSAIDs.  Outpatient CBC showed decline in hemoglobin to 8.9 from 12.6 just 3 days prior.  He was advised to go to the ED for evaluation/admission.  In the ED he was Hemoccult positive.  Hemoglobin 8.7.  He received blood transfusion (one unit).  Hemoglobin at time of discharge was 9.1.  Follow-up hemoglobin on 09/10/2022 was 10.6.  EGD and colonoscopy as outlined below.  Dr. Marletta Lor recommended to continue single antiplatelet therapy but deferred to cardiology.  At time of discharge she was advised by attending to continue aspirin only until he can address with his cardiologist.   Patient doing well. No current GI bleeding.  Stool is dark on iron.  No abdominal pain.  No upper GI symptoms.  No lightheadedness, dizziness, chest pain.  Labs: Patient has had mildly low hemoglobin dating back to February 2023.  March 19, 2022 his hemoglobin was 10.4.  On Aug 24, 2022 when he first presented with bleeding, his hemoglobin was 12.6.  After several days of bleeding his hemoglobin dropped down to 8.6 lowest.  Labs from September 15, 2021, ferritin 20.  Aug 24, 2022, ferritin  29, iron sat 29%, TIBC 320, serum iron 93.  EGD Aug 29, 2022: -Small hiatal hernia -Normal stomach -Normal duodenal bulb, first portion of duodenum and second portion of duodenum.  Colonoscopy Aug 29, 2022: -Nonbleeding internal hemorrhoids -Diverticulosis -Distal ileum normal  Medications   Current Outpatient Medications  Medication Sig Dispense Refill   aspirin EC 325 MG tablet Take 1 tablet (325 mg total) by mouth daily. 100 tablet 3   Azelastine HCl 137 MCG/SPRAY SOLN USE 2 SPRAY(S) IN EACH NOSTRIL TWICE DAILY AS DIRECTED (Patient taking differently: Inhale 2 sprays into the lungs daily.) 30 mL 0   Coenzyme Q10 (CO Q 10 PO) Take by mouth.     fluticasone (FLONASE) 50 MCG/ACT nasal spray Place 1 spray into both nostrils daily.     iron polysaccharides (NU-IRON) 150 MG capsule Take 1 capsule (150 mg total) by mouth daily. 90 capsule 1   levocetirizine (XYZAL) 5 MG tablet TAKE 1 TABLET BY MOUTH ONCE DAILY IN THE EVENING 30 tablet 0   loperamide (IMODIUM) 2 MG capsule Take 2 mg by mouth as needed for diarrhea or loose stools.     Melatonin 10 MG TABS Take 10 mg by mouth at bedtime.     metoprolol succinate (TOPROL-XL) 50 MG 24 hr tablet Take 1 tablet (50 mg total) by mouth daily. Take with or immediately following a meal. 90 tablet 1   Multiple Vitamin (QUINTABS) TABS  Take 1 tablet by mouth daily.     nitroGLYCERIN (NITROSTAT) 0.4 MG SL tablet Place 1 tablet (0.4 mg total) under the tongue every 5 (five) minutes as needed for chest pain. 25 tablet 2   Omega-3 Fatty Acids (FISH OIL) 1000 MG CPDR Take 1 tablet by mouth 2 (two) times daily.     pantoprazole (PROTONIX) 40 MG tablet Take 1 tablet (40 mg total) by mouth 2 (two) times daily. 180 tablet 3   rosuvastatin (CRESTOR) 40 MG tablet Take 1 tablet (40 mg total) by mouth daily. 90 tablet 3   traMADol (ULTRAM) 50 MG tablet TAKE 1 TABLET BY MOUTH EVERY 12 HOURS AS NEEDED FOR MODERATE PAIN OR SEVERE PAIN 6 tablet 0   traZODone (DESYREL)  50 MG tablet TAKE 1 TABLET BY MOUTH AT BEDTIME 90 tablet 0   Vitamin D-Vitamin K (VITAMIN K2-VITAMIN D3 PO) Take by mouth.     No current facility-administered medications for this visit.    Allergies   Allergies as of 09/27/2022 - Review Complete 09/27/2022  Allergen Reaction Noted   Albuterol Anaphylaxis 07/15/2016   Azithromycin Tinitus 03/07/2014   Penicillins Shortness Of Breath 02/11/2011   Tadalafil Shortness Of Breath 04/03/2021   Aspartame and phenylalanine Other (See Comments) 02/11/2011   Topiramate Other (See Comments) 04/08/2011   Buspirone Other (See Comments) 09/09/2011   Hydrocodone Other (See Comments) 07/12/2011       Review of Systems   General: Negative for anorexia, weight loss, fever, chills, fatigue, weakness. ENT: Negative for hoarseness, difficulty swallowing , nasal congestion. CV: Negative for chest pain, angina, palpitations, dyspnea on exertion, peripheral edema.  Respiratory: Negative for dyspnea at rest, dyspnea on exertion, cough, sputum, wheezing.  GI: See history of present illness. GU:  Negative for dysuria, hematuria, urinary incontinence, urinary frequency, nocturnal urination.  Endo: Negative for unusual weight change.     Physical Exam   BP 114/75 (BP Location: Right Arm, Patient Position: Sitting, Cuff Size: Large)   Pulse 89   Temp 99.1 F (37.3 C) (Oral)   Ht 5\' 10"  (1.778 m)   Wt 201 lb 12.8 oz (91.5 kg)   SpO2 95%   BMI 28.96 kg/m    General: Well-nourished, well-developed in no acute distress.  Eyes: No icterus. Mouth: Oropharyngeal mucosa moist and pink   Extremities: No lower extremity edema. No clubbing or deformities. Neuro: Alert and oriented x 4   Skin: Warm and dry, no jaundice.   Psych: Alert and cooperative, normal mood and affect.  Labs   See HPI Imaging Studies   No results found.  Assessment/Plan:   Rectal bleeding/anemia: Recent suspected lower GI bleed, etiology not clear.  Colonoscopy and EGD as  outlined above.  Cannot rule out diverticular bleed.  Looking back through his records he had some iron deficiency noted earlier in 2023 which had improved with oral iron supplement.  Recent decline in hemoglobin in the setting of significant GI bleed.  Hemoglobin improving.  Remains on oral iron.  We will update labs.  If he has decline in hemoglobin less than 10, would consider capsule endoscopy.  Patient will be in touch with cardiology to discuss possibility of single antiplatelet medication. Dr. Wyline Mood had advised that he can stop aspirin and continue Plavix but patient noted recurrent angina off aspirin therefore had started back on both medications.  Patient to call with any recurrent bleeding.     Leanna Battles. Melvyn Neth, MHS, PA-C [facility]

## 2022-10-04 ENCOUNTER — Other Ambulatory Visit: Payer: Self-pay | Admitting: Internal Medicine

## 2022-10-04 DIAGNOSIS — J309 Allergic rhinitis, unspecified: Secondary | ICD-10-CM

## 2022-10-19 ENCOUNTER — Ambulatory Visit: Payer: Medicare HMO | Attending: Cardiology | Admitting: Cardiology

## 2022-10-19 VITALS — BP 128/80 | HR 85 | Ht 70.0 in | Wt 203.4 lb

## 2022-10-19 DIAGNOSIS — E782 Mixed hyperlipidemia: Secondary | ICD-10-CM

## 2022-10-19 DIAGNOSIS — I25118 Atherosclerotic heart disease of native coronary artery with other forms of angina pectoris: Secondary | ICD-10-CM | POA: Diagnosis not present

## 2022-10-19 DIAGNOSIS — Z79899 Other long term (current) drug therapy: Secondary | ICD-10-CM

## 2022-10-19 MED ORDER — ASPIRIN 81 MG PO TBEC: 81.0000 mg | DELAYED_RELEASE_TABLET | Freq: Every day | ORAL | Status: DC

## 2022-10-19 NOTE — Patient Instructions (Signed)
Medication Instructions:   Decrease Aspirin to 81mg  daily  Continue all other medications.     Labwork:  FLP - orders given  Reminder:  Nothing to eat or drink after 12 midnight prior to labs. Office will contact with results via phone, letter or mychart.     Testing/Procedures:  none  Follow-Up:  6 months   Any Other Special Instructions Will Be Listed Below (If Applicable).   If you need a refill on your cardiac medications before your next appointment, please call your pharmacy.

## 2022-10-19 NOTE — Progress Notes (Signed)
Clinical Summary [name] is a 72 y.o.male seen today for follow up of the following medical problems.      1.CAD - history of prior STEMI with PCI to RCA 01/2011 - admit 05/2021 with chest pain. Mild trop 55.  - 05/2021 cath: diffuse ISR RCA stent, repeated stenting of RCA. Interventional recommended DAPT x 1 year then stop ASA and continue plavix - 05/2021 echo: LVEF 55-60%, no WMAs        05/2022 would stop ASA and continue plavix alone per interventional cards recs - no chest pains, no SOB/DOE.   - plavix was stopped during 08/2022 admission, started back on just ASA apparently high dose - bleeding has resolved. - no chest pains      2. HTN - compliant with meds     3. Hyperlipidemia 12/2021 cholesterol too high, changed to crestor 40mg  daily - needs repeat panel       4. AAA - 4.8 cm by 05/2021 [place] - followed by vascular Dr Arbie Cookey.       5. EtoH abuse - prior history mentioend in chart   6. GI bleed - admit 08/2022, presented with melena Hgb down to 8.7 - transfused 1 unit pRBCs - no clear cause of bleeding from endoscopy, question if diverticular bleed     SH: recent sold house, bought camper and travelled extensively. [place] 18 months   Just bought a new camping trailer.  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 Overview:  01/2011 Formatting of this note might be different from the original. 01/2011     Allergies  Allergen Reactions   Albuterol Anaphylaxis   Azithromycin Tinitus    Hearing loss    Penicillins Shortness Of Breath   Tadalafil Shortness Of Breath   Aspartame And Phenylalanine Other (See Comments)    HEADACHES   Topiramate Other (See Comments)    Memory loss    Buspirone Other (See Comments)    Memory loss     Hydrocodone Other (See Comments)    Tingling all over     Current Outpatient Medications  Medication Sig Dispense Refill   aspirin EC 325 MG tablet Take 1 tablet (325 mg total) by mouth daily. 100 tablet 3   Azelastine HCl 137 MCG/SPRAY SOLN USE 2 SPRAY(S) IN EACH NOSTRIL TWICE DAILY AS DIRECTED (Patient taking differently: Inhale 2 sprays into the lungs daily.) 30 mL 0   Coenzyme Q10 (CO Q 10 PO) Take by mouth.     fluticasone (FLONASE) 50 MCG/ACT nasal spray Place 1 spray into both nostrils daily.     iron polysaccharides (NU-IRON) 150 MG capsule Take 1 capsule (150 mg total) by mouth daily. 90 capsule 1   levocetirizine (XYZAL) 5 MG tablet TAKE 1 TABLET BY MOUTH ONCE DAILY IN THE EVENING 30 tablet 0   loperamide (IMODIUM) 2 MG capsule Take 2 mg by mouth as needed for diarrhea or loose stools.     Melatonin 10 MG TABS Take 10 mg by mouth at bedtime.     metoprolol succinate (TOPROL-XL) 50 MG 24 hr tablet Take 1 tablet (50 mg total) by mouth daily. Take with or immediately following a meal. 90 tablet 1   Multiple Vitamin (QUINTABS) TABS Take 1 tablet by mouth daily.  nitroGLYCERIN (NITROSTAT) 0.4 MG SL tablet Place 1 tablet (0.4 mg total) under the tongue every 5 (five) minutes as needed for chest pain. 25 tablet 2   Omega-3 Fatty Acids (FISH OIL) 1000 MG CPDR Take 1 tablet by mouth 2 (two) times daily.     pantoprazole (PROTONIX) 40 MG tablet Take 1 tablet (40 mg total) by mouth 2 (two) times daily. 180 tablet 3   rosuvastatin (CRESTOR) 40 MG tablet Take 1 tablet (40 mg total) by mouth daily. 90 tablet 3   traMADol (ULTRAM) 50 MG tablet TAKE 1 TABLET BY MOUTH EVERY 12 HOURS AS NEEDED FOR MODERATE PAIN OR SEVERE PAIN 6 tablet 0   traZODone (DESYREL) 50 MG tablet TAKE 1 TABLET BY MOUTH AT BEDTIME 90 tablet 0   Vitamin D-Vitamin K (VITAMIN K2-VITAMIN D3 PO) Take by mouth.     No current facility-administered medications for this visit.     Past Surgical History:  Procedure  Laterality Date   CARDIAC CATHETERIZATION     COLONOSCOPY WITH PROPOFOL N/A 08/29/2022   Procedure: COLONOSCOPY WITH PROPOFOL;  Surgeon: Lanelle Bal, DO;  Location: [facility];  Service: Endoscopy;  Laterality: N/A;   CORONARY STENT INTERVENTION N/A 05/19/2021   Procedure: CORONARY STENT INTERVENTION;  Surgeon: Lyn Records, MD;  Location: [facility];  Service: Cardiovascular;  Laterality: N/A;   ESOPHAGOGASTRODUODENOSCOPY (EGD) WITH PROPOFOL N/A 08/29/2022   Procedure: ESOPHAGOGASTRODUODENOSCOPY (EGD) WITH PROPOFOL;  Surgeon: Lanelle Bal, DO;  Location: [facility];  Service: Endoscopy;  Laterality: N/A;   LEFT HEART CATH AND CORONARY ANGIOGRAPHY N/A 05/19/2021   Procedure: LEFT HEART CATH AND CORONARY ANGIOGRAPHY;  Surgeon: Lyn Records, MD;  Location: [facility];  Service: Cardiovascular;  Laterality: N/A;     Allergies  Allergen Reactions   Albuterol Anaphylaxis   Azithromycin Tinitus    Hearing loss    Penicillins Shortness Of Breath   Tadalafil Shortness Of Breath   Aspartame And Phenylalanine Other (See Comments)    HEADACHES   Topiramate Other (See Comments)    Memory loss    Buspirone Other (See Comments)    Memory loss    Hydrocodone Other (See Comments)    Tingling all over      Family History  Problem Relation Age of Onset   Sudden Cardiac Death Maternal Uncle      Social History Mr. Townsel reports that he quit smoking about 4 years ago. His smoking use included cigarettes. He has never used smokeless tobacco. Mr. Alamo reports that he does not currently use alcohol.   Review of Systems CONSTITUTIONAL: No weight loss, fever, chills, weakness or fatigue.  HEENT: Eyes: No visual loss, blurred vision, double vision or yellow sclerae.No hearing loss, sneezing, congestion, runny nose or sore throat.  SKIN: No rash or itching.  CARDIOVASCULAR: per hpi RESPIRATORY: No shortness of breath, cough or sputum.   GASTROINTESTINAL: No anorexia, nausea, vomiting or diarrhea. No abdominal pain or blood.  GENITOURINARY: No burning on urination, no polyuria NEUROLOGICAL: No headache, dizziness, syncope, paralysis, ataxia, numbness or tingling in the extremities. No change in bowel or bladder control.  MUSCULOSKELETAL: No muscle, back pain, joint pain or stiffness.  LYMPHATICS: No enlarged nodes. No history of splenectomy.  PSYCHIATRIC: No history of depression or anxiety.  ENDOCRINOLOGIC: No reports of sweating, cold or heat intolerance. No polyuria or polydipsia.  Marland Kitchen   Physical Examination Today's Vitals   10/19/22 1057  BP: 128/80  Pulse: 85  SpO2: 96%  Weight: 203 lb 6.4 oz (92.3 kg)  Height: 5\' 10"  (1.778 m)   Body mass index is 29.18 kg/m.  Gen: resting comfortably, no acute distress HEENT: no scleral icterus, pupils equal round and reactive, no palptable cervical adenopathy,  CV: RRR, no m/rg, no jvd Resp: Clear to auscultation bilaterally GI: abdomen is soft, non-tender, non-distended, normal bowel sounds, no hepatosplenomegaly MSK: extremities are warm, no edema.  Skin: warm, no rash Neuro:  no focal deficits Psych: appropriate affect   Diagnostic Studies  Abd U/S 05/06/21:   Summary:  Abdominal Aorta: There is evidence of abnormal dilatation of the mid  Abdominal aorta. The largest aortic diameter has increased compared to  prior exam. Previous diameter measurement was 3.8 cm obtained on 08/21/17  by [facility].      TTE 08/22/17:   Interpretation Summary  A complete portable two-dimensional transthoracic echocardiogram with color  flow Doppler and Spectral Doppler was performed. The left ventricle is  normal in size.  There is mild concentric left ventricular hypertrophy.  The left ventricular ejection fraction is normal (60-65%).  The left ventricular wall motion is normal.  Mild aortic sclerosis is present with good valvular opening.  There is mild aortic root dilatation.   The aortic valve is trileaflet.  Grade I mild diastolic dysfunction; abnormal relaxation pattern.  Estimation of right ventricular systolic pressure is not possible.      LHC 01/16/11:   CONCLUSIONS:  1.  Single vessel obstructive coronary artery disease in the setting of an  ST elevation myocardial infarction.  2.  Status post PCI to the proximal and mid RCA with a 3.0 x 28 and 3.5 x  12 mm vision bare-metal stents.  Lesion length was 30 mm, eccentric, mild  calcium, definite thrombus.  Lesion went from a 100% pre to 0% post  residual.  Pre-TIMI flow was 0, post-TIMI flow was 3.  3.  Right radial access.  4.  Preserved left ventricular function.    05/2021 cath Diffuse in-stent restenosis proximal to mid RCA with worse region up to 95%. Angioplasty/scoring balloon followed by restenting starting beyond the margin of the previously placed stent and extending back to the proximal segment reducing to 0% stenosis with TIMI grade III flow. Widely patent left main LAD beyond the newly placed stent contains eccentric 60% stenosis and tandem 30 to 40% stenoses in the distal segment. 50 to 60% mid circumflex. LV function is normal.  LVEDP 11 mmHg.   RECOMMENDATIONS:   Dual antiplatelet therapy for 1 year then drop aspirin and continue clopidogrel indefinitely. Aggressive risk factor modification with LDL lowering to less than 55. Eligible for discharge in a.m. unless recurrent symptoms.     05/2021 echo  IMPRESSIONS     1. Left ventricular ejection fraction, by estimation, is 55 to 60%. The  left ventricle has normal function. The left ventricle has no regional  wall motion abnormalities. Left ventricular diastolic parameters were  normal.   2. Right ventricular systolic function is normal. The right ventricular  size is normal. There is normal pulmonary artery systolic pressure. The  estimated right ventricular systolic pressure is 32.2 mmHg.   3. The mitral valve is grossly  normal. No evidence of mitral valve  regurgitation. No evidence of mitral stenosis.   4. The aortic valve is tricuspid. Aortic valve regurgitation is trivial.  No aortic stenosis is present.   5. The inferior vena cava is normal in size with <50% respiratory  variability, suggesting  right atrial pressure of 8 mmHg.          Assessment and Plan   1.CAD with stable angina - recent stenting as reported above - prior chest pains resovled on imdur - was on plavix monotherapy, he reported recurrent angina and started back on ASA with plavix and felt better. On DAPT had GI bleed, since discharge off plavix and just on ASA 325mg . Will lower ASA to 81mg , can remain off plavix.    2. Hyperlipidemia -repeat lipid panel, goal LDL <55   3. HTN -he is at goal, continue current meds     Antoine Poche, M.D.,

## 2022-10-28 ENCOUNTER — Other Ambulatory Visit (HOSPITAL_COMMUNITY)
Admission: RE | Admit: 2022-10-28 | Discharge: 2022-10-28 | Disposition: A | Discharge status: Home / Self Care | Payer: Medicare HMO | Source: Ambulatory Visit | Attending: Cardiology | Admitting: Cardiology

## 2022-10-28 DIAGNOSIS — E782 Mixed hyperlipidemia: Secondary | ICD-10-CM | POA: Insufficient documentation

## 2022-10-28 DIAGNOSIS — I25118 Atherosclerotic heart disease of native coronary artery with other forms of angina pectoris: Secondary | ICD-10-CM | POA: Diagnosis not present

## 2022-10-28 DIAGNOSIS — Z79899 Other long term (current) drug therapy: Secondary | ICD-10-CM | POA: Diagnosis not present

## 2022-10-28 LAB — LIPID PANEL
Cholesterol: 130 mg/dL (ref 0–200)
HDL: 47 mg/dL (ref >40)
LDL Cholesterol: 64 mg/dL (ref 0–99)
Total CHOL/HDL Ratio: 2.8 RATIO
Triglycerides: 95 mg/dL (ref <150)
VLDL: 19 mg/dL (ref 0–40)

## 2022-11-01 IMAGING — DX DG CHEST 1V PORT
1 series · 1 of 1 positions shown · non-contrast
Comparison: 09/10/2017

CLINICAL DATA: Chest pain

EXAM:
PORTABLE CHEST 1 VIEW

[chest ap]
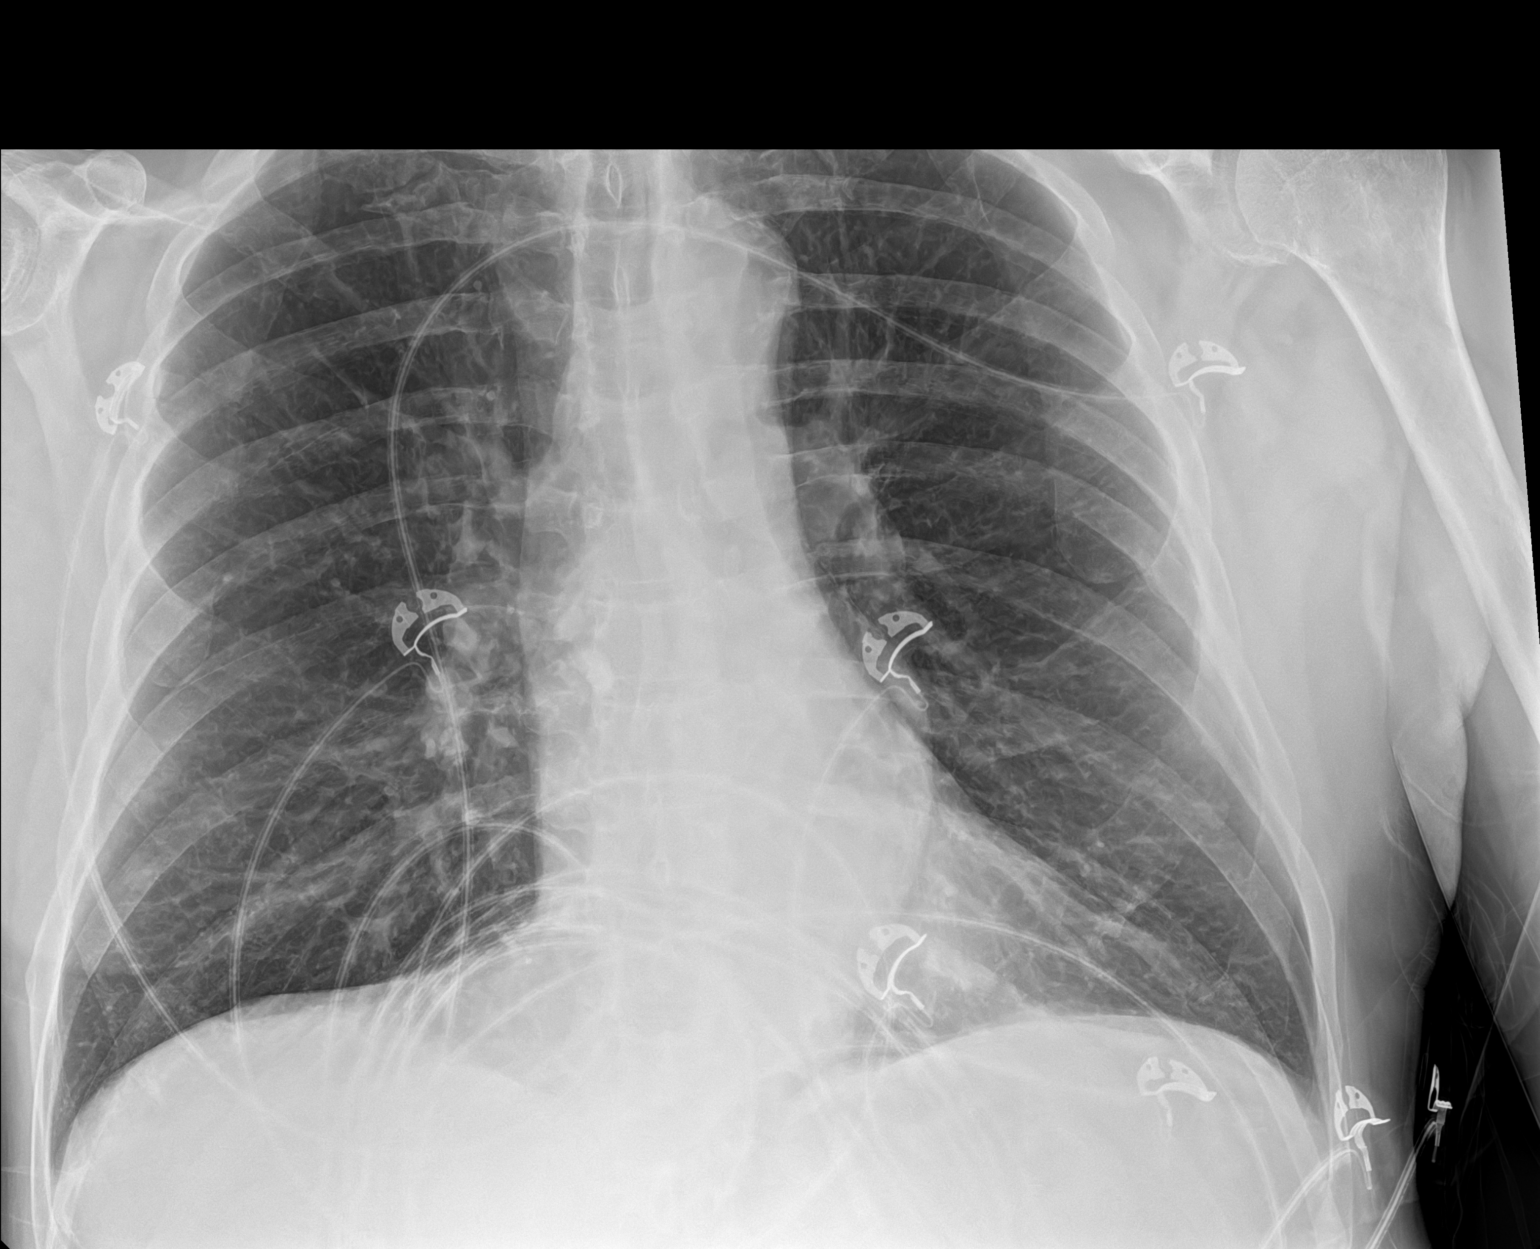

[1 of 1 positions shown; findings below may reference images not displayed]

FINDINGS: Heart and mediastinal contours are within normal limits. No focal
opacities or effusions. No acute bony abnormality.
IMPRESSION: No active disease.

## 2022-11-02 ENCOUNTER — Other Ambulatory Visit: Payer: Self-pay | Admitting: Internal Medicine

## 2022-11-02 DIAGNOSIS — J309 Allergic rhinitis, unspecified: Secondary | ICD-10-CM

## 2022-11-04 DIAGNOSIS — K625 Hemorrhage of anus and rectum: Secondary | ICD-10-CM | POA: Diagnosis not present

## 2022-11-08 ENCOUNTER — Other Ambulatory Visit: Payer: Self-pay

## 2022-11-08 DIAGNOSIS — D509 Iron deficiency anemia, unspecified: Secondary | ICD-10-CM

## 2022-11-19 ENCOUNTER — Encounter: Payer: Self-pay | Admitting: *Deleted

## 2022-11-19 ENCOUNTER — Other Ambulatory Visit: Payer: Self-pay | Admitting: *Deleted

## 2022-11-19 MED ORDER — EZETIMIBE 10 MG PO TABS: 10.0000 mg | ORAL_TABLET | Freq: Every day | ORAL | 6 refills | Status: DC

## 2022-11-20 ENCOUNTER — Other Ambulatory Visit: Payer: Self-pay | Admitting: Internal Medicine

## 2022-11-20 DIAGNOSIS — F5101 Primary insomnia: Secondary | ICD-10-CM

## 2022-11-28 ENCOUNTER — Other Ambulatory Visit: Payer: Self-pay | Admitting: Internal Medicine

## 2022-11-28 DIAGNOSIS — F5101 Primary insomnia: Secondary | ICD-10-CM

## 2022-11-29 ENCOUNTER — Other Ambulatory Visit: Payer: Self-pay | Admitting: Internal Medicine

## 2022-11-29 DIAGNOSIS — M5136 Other intervertebral disc degeneration, lumbar region: Secondary | ICD-10-CM

## 2022-11-29 MED ORDER — FLUTICASONE PROPIONATE 50 MCG/ACT NA SUSP: 1.0000 | Freq: Every day | NASAL | 0 refills | Status: DC

## 2022-12-01 ENCOUNTER — Other Ambulatory Visit: Payer: Self-pay | Admitting: Internal Medicine

## 2022-12-01 DIAGNOSIS — J309 Allergic rhinitis, unspecified: Secondary | ICD-10-CM

## 2022-12-16 ENCOUNTER — Ambulatory Visit: Payer: Medicare HMO | Admitting: Cardiology

## 2022-12-16 ENCOUNTER — Other Ambulatory Visit: Payer: Self-pay | Admitting: Cardiology

## 2022-12-16 NOTE — Progress Notes (Deleted)
Clinical Summary [name] is a 72 y.o.male  seen today for follow up of the following medical problems.      1.CAD - history of prior STEMI with PCI to RCA 01/2011 - admit 05/2021 with chest pain. Mild trop 55.  - 05/2021 cath: diffuse ISR RCA stent, repeated stenting of RCA. Interventional recommended DAPT x 1 year then stop ASA and continue plavix - 05/2021 echo: LVEF 55-60%, no WMAs        05/2022 would stop ASA and continue plavix alone per interventional cards recs - no chest pains, no SOB/DOE.   - plavix was stopped during 08/2022 admission for GI bleed, started back on just ASA apparently high dose - bleeding has resolved. - no chest pains         2. HTN - compliant with meds     3. Hyperlipidemia 12/2021 cholesterol too high, changed to crestor 40mg  daily - needs repeat panel       4. AAA - 4.8 cm by 05/2021 [place] - followed by vascular Dr Arbie Cookey.        5. EtoH abuse - prior history mentioend in chart    6. GI bleed - admit 08/2022, presented with melena Hgb down to 8.7 - transfused 1 unit pRBCs - no clear cause of bleeding from endoscopy, question if diverticular bleed     SH: recent sold house, bought camper and travelled extensively. [place] 18 months   Just bought a new camping trailer.  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 Overview:  01/2011 Formatting of this note might be different from the original. 01/2011     Allergies  Allergen Reactions   Albuterol Anaphylaxis   Azithromycin Tinitus    Hearing loss    Penicillins Shortness Of Breath   Tadalafil Shortness Of Breath   Aspartame And Phenylalanine Other (See Comments)    HEADACHES   Topiramate Other (See Comments)    Memory loss    Buspirone Other (See  Comments)    Memory loss    Hydrocodone Other (See Comments)    Tingling all over     Current Outpatient Medications  Medication Sig Dispense Refill   aspirin EC 81 MG tablet Take 1 tablet (81 mg total) by mouth daily.     Azelastine HCl 137 MCG/SPRAY SOLN USE 2 SPRAY(S) IN EACH NOSTRIL TWICE DAILY AS DIRECTED (Patient taking differently: Inhale 2 sprays into the lungs daily.) 30 mL 0   Coenzyme Q10 (CO Q 10 PO) Take by mouth.     ezetimibe (ZETIA) 10 MG tablet Take 1 tablet (10 mg total) by mouth daily. 30 tablet 6   FIBER COMPLETE PO Take 2 tablets by mouth daily.     fluticasone (FLONASE) 50 MCG/ACT nasal spray Place 1 spray into both nostrils daily. 9.9 mL 0   iron polysaccharides (NU-IRON) 150 MG capsule Take 1 capsule (150 mg total) by mouth daily. 90 capsule 1   levocetirizine (XYZAL) 5 MG tablet TAKE 1 TABLET BY MOUTH ONCE DAILY IN THE EVENING 30 tablet 0   Melatonin 10 MG TABS Take 10 mg by mouth at bedtime.     metoprolol succinate (TOPROL-XL) 50 MG 24 hr tablet Take 1 tablet (50 mg total) by mouth daily. Take with or immediately  following a meal. 90 tablet 1   nitroGLYCERIN (NITROSTAT) 0.4 MG SL tablet Place 1 tablet (0.4 mg total) under the tongue every 5 (five) minutes as needed for chest pain. 25 tablet 2   Omega-3 Fatty Acids (FISH OIL) 1000 MG CPDR Take 1 tablet by mouth 2 (two) times daily.     pantoprazole (PROTONIX) 40 MG tablet Take 1 tablet (40 mg total) by mouth 2 (two) times daily. 180 tablet 3   rosuvastatin (CRESTOR) 40 MG tablet Take 1 tablet (40 mg total) by mouth daily. 90 tablet 3   traMADol (ULTRAM) 50 MG tablet Take 1 tablet (50 mg total) by mouth every 12 (twelve) hours as needed for moderate pain or severe pain. 14 tablet 0   traZODone (DESYREL) 50 MG tablet TAKE 1 TABLET BY MOUTH AT BEDTIME 90 tablet 0   Vitamin D-Vitamin K (VITAMIN K2-VITAMIN D3 PO) Take by mouth.     No current facility-administered medications for this visit.     Past Surgical  History:  Procedure Laterality Date   CARDIAC CATHETERIZATION     COLONOSCOPY WITH PROPOFOL N/A 08/29/2022   Procedure: COLONOSCOPY WITH PROPOFOL;  Surgeon: Lanelle Bal, DO;  Location: [facility];  Service: Endoscopy;  Laterality: N/A;   CORONARY STENT INTERVENTION N/A 05/19/2021   Procedure: CORONARY STENT INTERVENTION;  Surgeon: Lyn Records, MD;  Location: [facility];  Service: Cardiovascular;  Laterality: N/A;   ESOPHAGOGASTRODUODENOSCOPY (EGD) WITH PROPOFOL N/A 08/29/2022   Procedure: ESOPHAGOGASTRODUODENOSCOPY (EGD) WITH PROPOFOL;  Surgeon: Lanelle Bal, DO;  Location: [facility];  Service: Endoscopy;  Laterality: N/A;   LEFT HEART CATH AND CORONARY ANGIOGRAPHY N/A 05/19/2021   Procedure: LEFT HEART CATH AND CORONARY ANGIOGRAPHY;  Surgeon: Lyn Records, MD;  Location: [facility];  Service: Cardiovascular;  Laterality: N/A;     Allergies  Allergen Reactions   Albuterol Anaphylaxis   Azithromycin Tinitus    Hearing loss    Penicillins Shortness Of Breath   Tadalafil Shortness Of Breath   Aspartame And Phenylalanine Other (See Comments)    HEADACHES   Topiramate Other (See Comments)    Memory loss    Buspirone Other (See Comments)    Memory loss    Hydrocodone Other (See Comments)    Tingling all over      Family History  Problem Relation Age of Onset   Sudden Cardiac Death Maternal Uncle      Social History [name] reports that he quit smoking about 4 years ago. His smoking use included cigarettes. He has never used smokeless tobacco. [name] reports that he does not currently use alcohol.   Review of Systems CONSTITUTIONAL: No weight loss, fever, chills, weakness or fatigue.  HEENT: Eyes: No visual loss, blurred vision, double vision or yellow sclerae.No hearing loss, sneezing, congestion, runny nose or sore throat.  SKIN: No rash or itching.  CARDIOVASCULAR:  RESPIRATORY: No shortness of breath, cough or sputum.   GASTROINTESTINAL: No anorexia, nausea, vomiting or diarrhea. No abdominal pain or blood.  GENITOURINARY: No burning on urination, no polyuria NEUROLOGICAL: No headache, dizziness, syncope, paralysis, ataxia, numbness or tingling in the extremities. No change in bowel or bladder control.  MUSCULOSKELETAL: No muscle, back pain, joint pain or stiffness.  LYMPHATICS: No enlarged nodes. No history of splenectomy.  PSYCHIATRIC: No history of depression or anxiety.  ENDOCRINOLOGIC: No reports of sweating, cold or heat intolerance. No polyuria or polydipsia.  Marland Kitchen   Physical Examination There were  no vitals filed for this visit. There were no vitals filed for this visit.  Gen: resting comfortably, no acute distress HEENT: no scleral icterus, pupils equal round and reactive, no palptable cervical adenopathy,  CV Resp: Clear to auscultation bilaterally GI: abdomen is soft, non-tender, non-distended, normal bowel sounds, no hepatosplenomegaly MSK: extremities are warm, no edema.  Skin: warm, no rash Neuro:  no focal deficits Psych: appropriate affect   Diagnostic Studies  Abd U/S 05/06/21:   Summary:  Abdominal Aorta: There is evidence of abnormal dilatation of the mid  Abdominal aorta. The largest aortic diameter has increased compared to  prior exam. Previous diameter measurement was 3.8 cm obtained on 08/21/17  by [facility].      TTE 08/22/17:   Interpretation Summary  A complete portable two-dimensional transthoracic echocardiogram with color  flow Doppler and Spectral Doppler was performed. The left ventricle is  normal in size.  There is mild concentric left ventricular hypertrophy.  The left ventricular ejection fraction is normal (60-65%).  The left ventricular wall motion is normal.  Mild aortic sclerosis is present with good valvular opening.  There is mild aortic root dilatation.  The aortic valve is trileaflet.  Grade I mild diastolic dysfunction; abnormal relaxation pattern.   Estimation of right ventricular systolic pressure is not possible.      LHC 01/16/11:   CONCLUSIONS:  1.  Single vessel obstructive coronary artery disease in the setting of an  ST elevation myocardial infarction.  2.  Status post PCI to the proximal and mid RCA with a 3.0 x 28 and 3.5 x  12 mm vision bare-metal stents.  Lesion length was 30 mm, eccentric, mild  calcium, definite thrombus.  Lesion went from a 100% pre to 0% post  residual.  Pre-TIMI flow was 0, post-TIMI flow was 3.  3.  Right radial access.  4.  Preserved left ventricular function.    05/2021 cath Diffuse in-stent restenosis proximal to mid RCA with worse region up to 95%. Angioplasty/scoring balloon followed by restenting starting beyond the margin of the previously placed stent and extending back to the proximal segment reducing to 0% stenosis with TIMI grade III flow. Widely patent left main LAD beyond the newly placed stent contains eccentric 60% stenosis and tandem 30 to 40% stenoses in the distal segment. 50 to 60% mid circumflex. LV function is normal.  LVEDP 11 mmHg.   RECOMMENDATIONS:   Dual antiplatelet therapy for 1 year then drop aspirin and continue clopidogrel indefinitely. Aggressive risk factor modification with LDL lowering to less than 55. Eligible for discharge in a.m. unless recurrent symptoms.     05/2021 echo  IMPRESSIONS     1. Left ventricular ejection fraction, by estimation, is 55 to 60%. The  left ventricle has normal function. The left ventricle has no regional  wall motion abnormalities. Left ventricular diastolic parameters were  normal.   2. Right ventricular systolic function is normal. The right ventricular  size is normal. There is normal pulmonary artery systolic pressure. The  estimated right ventricular systolic pressure is 32.2 mmHg.   3. The mitral valve is grossly normal. No evidence of mitral valve  regurgitation. No evidence of mitral stenosis.   4. The aortic  valve is tricuspid. Aortic valve regurgitation is trivial.  No aortic stenosis is present.   5. The inferior vena cava is normal in size with <50% respiratory  variability, suggesting right atrial pressure of 8 mmHg.      Assessment and Plan  1.CAD with stable angina - recent stenting as reported above - prior chest pains resovled on imdur - was on plavix monotherapy, he reported recurrent angina and started back on ASA with plavix and felt better. On DAPT had GI bleed, since discharge off plavix and just on ASA 325mg . Will lower ASA to 81mg , can remain off plavix.    2. Hyperlipidemia -repeat lipid panel, goal LDL <55   3. HTN -he is at goal, continue current meds     Antoine Poche, M.D.

## 2022-12-17 ENCOUNTER — Other Ambulatory Visit: Payer: Self-pay | Admitting: Internal Medicine

## 2022-12-21 ENCOUNTER — Ambulatory Visit (INDEPENDENT_AMBULATORY_CARE_PROVIDER_SITE_OTHER): Payer: Medicare HMO | Admitting: Family Medicine

## 2022-12-21 ENCOUNTER — Encounter: Payer: Self-pay | Admitting: Family Medicine

## 2022-12-21 VITALS — BP 131/87 | HR 88 | Ht 70.0 in | Wt 203.0 lb

## 2022-12-21 DIAGNOSIS — J209 Acute bronchitis, unspecified: Secondary | ICD-10-CM | POA: Insufficient documentation

## 2022-12-21 DIAGNOSIS — I10 Essential (primary) hypertension: Secondary | ICD-10-CM

## 2022-12-21 DIAGNOSIS — J309 Allergic rhinitis, unspecified: Secondary | ICD-10-CM

## 2022-12-21 MED ORDER — PREDNISONE 5 MG PO TABS: 5.0000 mg | ORAL_TABLET | Freq: Two times a day (BID) | ORAL | 0 refills | Status: AC

## 2022-12-21 MED ORDER — SULFAMETHOXAZOLE-TRIMETHOPRIM 800-160 MG PO TABS: 1.0000 | ORAL_TABLET | Freq: Two times a day (BID) | ORAL | 0 refills | Status: DC

## 2022-12-21 NOTE — Assessment & Plan Note (Signed)
Controlled, no change in medication  

## 2022-12-21 NOTE — Assessment & Plan Note (Signed)
Uncontrolled , short course of prednisone x 5 days to improve symptoms, continue maintainace medication for allergies as before, also recently added mucinex twice daily for next 5 days

## 2022-12-21 NOTE — Assessment & Plan Note (Signed)
1 week septra DS twice daily prescibed

## 2022-12-21 NOTE — Progress Notes (Signed)
Jeremy Jenkins     MRN: [id number]      DOB: 09-18-1950  Chief Complaint  Patient presents with   Cough    Cough x 2 weeks more in throat area feels like drainage     HPI Jeremy Jenkins is here fwith a 2 week h/o increased post nasal drainage with rattling cough, unable to produce sputum, denies documented fever or chills C/o pressure on left cheek with increased left post nasal drainage  No other concerns addressed at visit  ROS  Denies chest pains, palpitations and leg swelling Denies abdominal pain, nausea, vomiting,diarrhea or constipation.   Denies dysuria, frequency, hesitancy or incontinence. Denies joint pain, swelling and limitation in mobility. Denies headaches, seizures, numbness, or tingling. Denies depression, anxiety or insomnia. Denies skin break down or rash.   PE  BP 131/87 (BP Location: Right Arm, Patient Position: Sitting, Cuff Size: Large)   Pulse 88   Ht 5\' 10"  (1.778 m)   Wt 203 lb 0.6 oz (92.1 kg)   SpO2 93%   BMI 29.13 kg/m   Patient alert and oriented and in no cardiopulmonary distress.  HEENT: No facial asymmetry, EOMI,     Neck supple .left maxillary tenderness  Chest: bilateral crackles , reduced air entry htroughout  CVS: S1, S2 no murmurs, no S3.Regular rate.  ABD: Soft non tender.   Ext: No edema  MS: Adequate ROM spine, shoulders, hips and knees.  Skin: Intact, no ulcerations or rash noted.  Psych: Good eye contact, normal affect. Memory intact not anxious or depressed appearing.  CNS: CN 2-12 intact, power,  normal throughout.no focal deficits noted.   Assessment & Plan  Allergic sinusitis Uncontrolled , short course of prednisone x 5 days to improve symptoms, continue maintainace medication for allergies as before, also recently added mucinex twice daily for next 5 days  Benign essential hypertension Controlled, no change in medication   Acute bronchitis 1 week septra DS twice daily prescibed

## 2022-12-21 NOTE — Patient Instructions (Addendum)
F/U with dr Allena Katz in 3 to 5 weeks , call if you need to be seen sooner  Prednisone and septra are prescribed. Continue all medications that you are currently taking  You are treated for uncontrolled allergy symptoms with cough  Thanks for choosing [facility], we consider it a privelige to serve you.

## 2023-01-01 ENCOUNTER — Other Ambulatory Visit: Payer: Self-pay | Admitting: Internal Medicine

## 2023-01-01 DIAGNOSIS — J309 Allergic rhinitis, unspecified: Secondary | ICD-10-CM

## 2023-01-05 ENCOUNTER — Other Ambulatory Visit: Payer: Self-pay | Admitting: Internal Medicine

## 2023-01-05 ENCOUNTER — Other Ambulatory Visit: Payer: Self-pay | Admitting: Family Medicine

## 2023-01-12 ENCOUNTER — Other Ambulatory Visit: Payer: Self-pay | Admitting: Internal Medicine

## 2023-01-12 ENCOUNTER — Other Ambulatory Visit: Payer: Self-pay | Admitting: Family Medicine

## 2023-01-12 DIAGNOSIS — J309 Allergic rhinitis, unspecified: Secondary | ICD-10-CM

## 2023-01-12 DIAGNOSIS — M51369 Other intervertebral disc degeneration, lumbar region without mention of lumbar back pain or lower extremity pain: Secondary | ICD-10-CM

## 2023-01-24 ENCOUNTER — Other Ambulatory Visit: Payer: Self-pay

## 2023-01-24 DIAGNOSIS — D509 Iron deficiency anemia, unspecified: Secondary | ICD-10-CM

## 2023-01-30 ENCOUNTER — Other Ambulatory Visit: Payer: Self-pay | Admitting: Internal Medicine

## 2023-01-30 DIAGNOSIS — J309 Allergic rhinitis, unspecified: Secondary | ICD-10-CM

## 2023-02-01 ENCOUNTER — Ambulatory Visit (INDEPENDENT_AMBULATORY_CARE_PROVIDER_SITE_OTHER): Payer: Medicare HMO | Admitting: Internal Medicine

## 2023-02-01 VITALS — BP 135/83 | HR 88 | Resp 16 | Ht 70.0 in | Wt 203.8 lb

## 2023-02-01 DIAGNOSIS — R7303 Prediabetes: Secondary | ICD-10-CM

## 2023-02-01 DIAGNOSIS — E559 Vitamin D deficiency, unspecified: Secondary | ICD-10-CM

## 2023-02-01 DIAGNOSIS — I10 Essential (primary) hypertension: Secondary | ICD-10-CM | POA: Diagnosis not present

## 2023-02-01 DIAGNOSIS — D509 Iron deficiency anemia, unspecified: Secondary | ICD-10-CM

## 2023-02-01 DIAGNOSIS — I7143 Infrarenal abdominal aortic aneurysm, without rupture: Secondary | ICD-10-CM | POA: Diagnosis not present

## 2023-02-01 DIAGNOSIS — Z23 Encounter for immunization: Secondary | ICD-10-CM

## 2023-02-01 DIAGNOSIS — E782 Mixed hyperlipidemia: Secondary | ICD-10-CM

## 2023-02-01 DIAGNOSIS — J309 Allergic rhinitis, unspecified: Secondary | ICD-10-CM | POA: Diagnosis not present

## 2023-02-01 DIAGNOSIS — Z125 Encounter for screening for malignant neoplasm of prostate: Secondary | ICD-10-CM | POA: Diagnosis not present

## 2023-02-01 DIAGNOSIS — R053 Chronic cough: Secondary | ICD-10-CM | POA: Diagnosis not present

## 2023-02-01 DIAGNOSIS — F5104 Psychophysiologic insomnia: Secondary | ICD-10-CM

## 2023-02-01 DIAGNOSIS — K219 Gastro-esophageal reflux disease without esophagitis: Secondary | ICD-10-CM | POA: Diagnosis not present

## 2023-02-01 MED ORDER — SUCRALFATE 1 G PO TABS
1.0000 g | ORAL_TABLET | Freq: Two times a day (BID) | ORAL | 0 refills | Status: AC
Start: 2023-02-01 — End: ?

## 2023-02-01 MED ORDER — BENZONATATE 200 MG PO CAPS: 200.0000 mg | ORAL_CAPSULE | Freq: Two times a day (BID) | ORAL | 0 refills | Status: DC | PRN

## 2023-02-01 MED ORDER — MONTELUKAST SODIUM 10 MG PO TABS
10.0000 mg | ORAL_TABLET | Freq: Every day | ORAL | 5 refills | Status: AC
Start: 2023-02-01 — End: ?

## 2023-02-01 NOTE — Assessment & Plan Note (Signed)
On pantoprazole now Epigastric discomfort likely due to GERD/gastritis although EGD was unremarkable recently Added sucralfate Advised to contact GI

## 2023-02-01 NOTE — Assessment & Plan Note (Addendum)
On statin and Zetia Checked lipid profile, overall improving

## 2023-02-01 NOTE — Patient Instructions (Addendum)
Please start taking Singulair for allergies instead of Xyzal.  Okay to use Phenol or Chloraseptic throat spray as needed for throat irritation.  Please continue taking Pantoprazole as prescribed. Please take Sucralfate as prescribed with meals.

## 2023-02-01 NOTE — Progress Notes (Signed)
Established Patient Office Visit  Subjective:  Patient ID: Jeremy Jenkins, male    DOB: 1950-12-22  Age: 72 y.o. MRN: [id number]  CC:  Chief Complaint  Patient presents with   Cough    Chronic feeling of something stuck in the back of his throat like a flap of skin and it makes him have to clear his throat and cough all the time, worse at night. Has taken allergy meds, flonase and zyrtec, benadryl and nothing is clearing it up    Abdominal Pain    X a couple months in his mid left abdomen that is sometimes tender to the touch and its usually after he eats. No bulge felt just sore to the touch     HPI Jeremy Jenkins is a 72 y.o. male with past medical history of CAD s/p stent placement, HLD and aortic aneurysm who presents for persistent cough and f/u of his chronic medical conditions.  Cough: He has chronic cough for the last 3 months.  He feels scratchy throat and constant urge to clear his throat.  His nasal congestion and postnasal drip had initially improved with Xyzal, but have recurred now. Denies any fever or chills currently.  GERD: He reports epigastric discomfort, worse after eating.  He has noted tenderness in the left upper quadrant area as well.  He currently takes pantoprazole 40 mg twice daily.  He had an episode of rectal bleeding in 05/24, and had EGD and colonoscopy, which were unremarkable.  He currently denies any melena or hematochezia.  CAD: He has been taking aspirin and statin. He was given Imdur for anginal chest pain, but had severe headache with it and had to stop taking it.  He reports that his chest pain episodes have resolved now. He denies any dyspnea or palpitations currently. BP is wnl.   He takes trazodone as needed for insomnia.  He has talked to [facility] therapist for GAD.  Denies any SI or HI currently.    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 Overview:  01/2011 Formatting of this note might be different from the original. 01/2011    Past Surgical History:  Procedure Laterality Date   CARDIAC CATHETERIZATION     COLONOSCOPY WITH PROPOFOL N/A 08/29/2022   Procedure: COLONOSCOPY WITH PROPOFOL;  Surgeon: Lanelle Bal, DO;  Location: [facility];  Service: Endoscopy;  Laterality: N/A;   CORONARY STENT INTERVENTION N/A 05/19/2021   Procedure: CORONARY STENT INTERVENTION;  Surgeon: Lyn Records, MD;  Location: [facility] INV[facility];  Service: Cardiovascular;  Laterality: N/A;   ESOPHAGOGASTRODUODENOSCOPY (EGD) WITH PROPOFOL N/A 08/29/2022   Procedure: ESOPHAGOGASTRODUODENOSCOPY (EGD) WITH PROPOFOL;  Surgeon: Lanelle Bal, DO;  Location: [facility];  Service: Endoscopy;  Laterality: N/A;   LEFT HEART CATH AND CORONARY ANGIOGRAPHY N/A 05/19/2021   Procedure: LEFT HEART CATH AND CORONARY ANGIOGRAPHY;  Surgeon: Lyn Records, MD;  Location: [facility];  Service: Cardiovascular;  Laterality: N/A;    Family History  Problem Relation Age of Onset   Sudden Cardiac Death Maternal Uncle     Social History   Socioeconomic History   Marital status: Married    Spouse name: Not on file   Number of children: Not on file   Years of education: Not  on file   Highest education level: Bachelor's degree (e.g., BA, AB, BS)  Occupational History   Not on file  Tobacco Use   Smoking status: Former    Current packs/day: 0.00    Types: Cigarettes    Quit date: 06/02/2018    Years since quitting: 4.6   Smokeless tobacco: Never  Vaping Use   Vaping status: Never Used  Substance and Sexual Activity   Alcohol use: Not Currently   Drug use: Never   Sexual activity: Not on file  Other Topics Concern   Not on file  Social History Narrative   Not on file   Social Determinants of Health   Financial Resource  Strain: Low Risk  (02/01/2023)   Overall Financial Resource Strain (CARDIA)    Difficulty of Paying Living Expenses: Not hard at all  Food Insecurity: No Food Insecurity (02/01/2023)   Hunger Vital Sign    Worried About Running Out of Food in the Last Year: Never true    Ran Out of Food in the Last Year: Never true  Transportation Needs: No Transportation Needs (02/01/2023)   PRAPARE - Administrator, Civil Service (Medical): No    Lack of Transportation (Non-Medical): No  Physical Activity: Unknown (02/01/2023)   Exercise Vital Sign    Days of Exercise per Week: 0 days    Minutes of Exercise per Session: Not on file  Stress: No Stress Concern Present (02/01/2023)   [employer] of Occupational Health - Occupational Stress Questionnaire    Feeling of Stress : Only a little  Social Connections: Socially Isolated (02/01/2023)   Social Connection and Isolation Panel [NHANES]    Frequency of Communication with Friends and Family: Never    Frequency of Social Gatherings with Friends and Family: Never    Attends Religious Services: Never    Database administrator or Organizations: No    Attends Engineer, structural: Not on file    Marital Status: Married  Catering manager Violence: Not At Risk (08/27/2022)   Humiliation, Afraid, Rape, and Kick questionnaire    Fear of Current or Ex-Partner: No    Emotionally Abused: No    Physically Abused: No    Sexually Abused: No    Outpatient Medications Prior to Visit  Medication Sig Dispense Refill   aspirin EC 81 MG tablet Take 1 tablet (81 mg total) by mouth daily.     Azelastine HCl 137 MCG/SPRAY SOLN USE 2 SPRAY(S) IN EACH NOSTRIL TWICE DAILY AS DIRECTED 30 mL 0   Coenzyme Q10 (CO Q 10 PO) Take by mouth.     ezetimibe (ZETIA) 10 MG tablet Take 1 tablet (10 mg total) by mouth daily. 30 tablet 6   FIBER COMPLETE PO Take 2 tablets by mouth daily.     fluticasone (FLONASE) 50 MCG/ACT nasal spray Use 1 spray(s) in each  nostril once daily 16 g 0   Melatonin 10 MG TABS Take 10 mg by mouth at bedtime.     metoprolol succinate (TOPROL-XL) 50 MG 24 hr tablet TAKE 1 TABLET BY MOUTH ONCE DAILY WITH OR IMMEDIATELY FOLLOWING A MEAL 90 tablet 0   nitroGLYCERIN (NITROSTAT) 0.4 MG SL tablet Place 1 tablet (0.4 mg total) under the tongue every 5 (five) minutes as needed for chest pain. 25 tablet 2   Omega-3 Fatty Acids (FISH OIL) 1000 MG CPDR Take 1 tablet by mouth 2 (two) times daily.     pantoprazole (PROTONIX) 40 MG tablet Take 1  tablet (40 mg total) by mouth 2 (two) times daily. 180 tablet 3   rosuvastatin (CRESTOR) 40 MG tablet Take 1 tablet by mouth once daily 90 tablet 3   traMADol (ULTRAM) 50 MG tablet Take 1 tablet (50 mg total) by mouth every 12 (twelve) hours as needed. 14 tablet 0   traZODone (DESYREL) 50 MG tablet TAKE 1 TABLET BY MOUTH AT BEDTIME 90 tablet 0   Vitamin D-Vitamin K (VITAMIN K2-VITAMIN D3 PO) Take by mouth.     levocetirizine (XYZAL) 5 MG tablet TAKE 1 TABLET BY MOUTH ONCE DAILY IN THE EVENING 30 tablet 0   iron polysaccharides (NU-IRON) 150 MG capsule Take 1 capsule (150 mg total) by mouth daily. 90 capsule 1   sulfamethoxazole-trimethoprim (BACTRIM DS) 800-160 MG tablet Take 1 tablet by mouth 2 (two) times daily. 14 tablet 0   No facility-administered medications prior to visit.    Allergies  Allergen Reactions   Albuterol Anaphylaxis   Azithromycin Tinitus    Hearing loss    Penicillins Shortness Of Breath   Tadalafil Shortness Of Breath   Aspartame And Phenylalanine Other (See Comments)    HEADACHES   Topiramate Other (See Comments)    Memory loss    Buspirone Other (See Comments)    Memory loss    Hydrocodone Other (See Comments)    Tingling all over    ROS Review of Systems  Constitutional:  Negative for chills and fever.  HENT:  Positive for congestion, postnasal drip, rhinorrhea, sore throat and tinnitus.   Eyes:  Negative for pain and discharge.  Respiratory:   Positive for cough. Negative for shortness of breath.   Cardiovascular:  Negative for chest pain and palpitations.  Gastrointestinal:  Negative for diarrhea, nausea and vomiting.  Endocrine: Negative for polydipsia and polyuria.  Genitourinary:  Negative for dysuria and hematuria.  Musculoskeletal:  Positive for back pain. Negative for neck pain and neck stiffness.  Skin:  Negative for rash.       Mole on back  Neurological:  Negative for dizziness, weakness, numbness and headaches.  Psychiatric/Behavioral:  Positive for sleep disturbance. Negative for agitation and behavioral problems.       Objective:    Physical Exam Vitals reviewed.  Constitutional:      General: He is not in acute distress.    Appearance: He is not diaphoretic.  HENT:     Head: Normocephalic and atraumatic.     Nose: Congestion present.     Mouth/Throat:     Mouth: Mucous membranes are moist.     Pharynx: Posterior oropharyngeal erythema present.  Eyes:     General: No scleral icterus.    Extraocular Movements: Extraocular movements intact.  Cardiovascular:     Rate and Rhythm: Normal rate and regular rhythm.     Heart sounds: Normal heart sounds. No murmur heard. Pulmonary:     Breath sounds: Normal breath sounds. No wheezing or rales.  Abdominal:     Palpations: Abdomen is soft.     Tenderness: There is abdominal tenderness (Mild, epigastric).  Musculoskeletal:     Cervical back: Neck supple. No tenderness.     Right lower leg: No edema.     Left lower leg: No edema.  Skin:    General: Skin is warm.     Findings: No lesion.  Neurological:     General: No focal deficit present.     Mental Status: He is alert and oriented to person, place, and time.  Sensory: No sensory deficit.     Motor: No weakness.  Psychiatric:        Mood and Affect: Mood normal.        Behavior: Behavior normal.     BP 135/83   Pulse 88   Resp 16   Ht 5\' 10"  (1.778 m)   Wt 203 lb 12.8 oz (92.4 kg)   SpO2 95%    BMI 29.24 kg/m  Wt Readings from Last 3 Encounters:  02/01/23 203 lb 12.8 oz (92.4 kg)  12/21/22 203 lb 0.6 oz (92.1 kg)  10/19/22 203 lb 6.4 oz (92.3 kg)    Lab Results  Component Value Date   TSH 3.177 05/19/2021   Lab Results  Component Value Date   WBC 9.5 11/04/2022   HGB 14.0 11/04/2022   HCT 43.6 11/04/2022   MCV 88 11/04/2022   PLT 188 11/04/2022   Lab Results  Component Value Date   NA 139 09/10/2022   K 4.9 09/10/2022   CO2 24 09/10/2022   GLUCOSE 128 (H) 09/10/2022   BUN 13 09/10/2022   CREATININE 1.12 09/10/2022   BILITOT 0.7 08/27/2022   ALKPHOS 48 08/27/2022   AST 16 08/27/2022   ALT 9 08/27/2022   PROT 6.5 08/27/2022   ALBUMIN 3.5 08/27/2022   CALCIUM 9.0 09/10/2022   ANIONGAP 9 08/28/2022   EGFR 70 09/10/2022   Lab Results  Component Value Date   CHOL 130 10/28/2022   Lab Results  Component Value Date   HDL 47 10/28/2022   Lab Results  Component Value Date   LDLCALC 64 10/28/2022   Lab Results  Component Value Date   TRIG 95 10/28/2022   Lab Results  Component Value Date   CHOLHDL 2.8 10/28/2022   Lab Results  Component Value Date   HGBA1C 6.6 (H) 03/19/2022      Assessment & Plan:   Problem List Items Addressed This Visit       Cardiovascular and Mediastinum   Essential hypertension    BP Readings from Last 1 Encounters:  02/01/23 135/83   Well-controlled, on metoprolol for CAD Counseled for compliance with the medications Advised DASH diet and moderate exercise/walking, at least 150 mins/week      Relevant Orders   TSH   Infrarenal abdominal aortic aneurysm (AAA) without rupture (HCC)    Follow-up with vascular surgery -last US showed  4.8 cm AAA His current symptoms are unlikely related to AAA as he has focal tenderness in the left upper quadrant area        Respiratory   Allergic sinusitis - Primary    His throat irritation is likely due to postnasal drip Uses Flonase Uncontrolled with Xyzal and  azelastine nasal spray Started Singulair Tessalon as needed for cough Had referred to ENT specialist - may need repeat evaluation if persistent throat irritation      Relevant Medications   montelukast (SINGULAIR) 10 MG tablet   benzonatate (TESSALON) 200 MG capsule     Digestive   Gastro-esophageal reflux disease without esophagitis    On pantoprazole now Epigastric discomfort likely due to GERD/gastritis although EGD was unremarkable recently Added sucralfate Advised to contact GI      Relevant Medications   sucralfate (CARAFATE) 1 g tablet     Other   Insomnia    Takes trazodone 50 mg nightly as needed      Prediabetes    Advised to follow low-carb diet for now Check CMP and HbA1c  Relevant Orders   Hemoglobin A1c   CMP14+EGFR   Vitamin D deficiency   Relevant Orders   VITAMIN D 25 Hydroxy (Vit-D Deficiency, Fractures)   Mixed hyperlipidemia    On statin and Zetia Checked lipid profile, overall improving      Other Visit Diagnoses     Prostate cancer screening       Relevant Orders   PSA   Microcytic anemia       Relevant Orders   CBC with Differential/Platelet   Fe+TIBC+Fer   Encounter for immunization       Relevant Orders   Flu Vaccine Trivalent High Dose (Fluad) (Completed)   Chronic cough       Relevant Medications   benzonatate (TESSALON) 200 MG capsule       Meds ordered this encounter  Medications   montelukast (SINGULAIR) 10 MG tablet    Sig: Take 1 tablet (10 mg total) by mouth at bedtime.    Dispense:  30 tablet    Refill:  5   sucralfate (CARAFATE) 1 g tablet    Sig: Take 1 tablet (1 g total) by mouth 2 (two) times daily.    Dispense:  30 tablet    Refill:  0   benzonatate (TESSALON) 200 MG capsule    Sig: Take 1 capsule (200 mg total) by mouth 2 (two) times daily as needed for cough.    Dispense:  30 capsule    Refill:  0    Follow-up: Return in about 3 months (around 05/04/2023) for CAD and prediabetes.    Anabel Halon, MD

## 2023-02-01 NOTE — Assessment & Plan Note (Addendum)
His throat irritation is likely due to postnasal drip Uses Flonase Uncontrolled with Xyzal and azelastine nasal spray Started Singulair Tessalon as needed for cough Had referred to ENT specialist - may need repeat evaluation if persistent throat irritation

## 2023-02-01 NOTE — Assessment & Plan Note (Signed)
BP Readings from Last 1 Encounters:  02/01/23 135/83   Well-controlled, on metoprolol for CAD Counseled for compliance with the medications Advised DASH diet and moderate exercise/walking, at least 150 mins/week

## 2023-02-01 NOTE — Assessment & Plan Note (Signed)
Takes trazodone 50 mg nightly as needed 

## 2023-02-01 NOTE — Assessment & Plan Note (Signed)
Advised to follow low-carb diet for now Check CMP and HbA1c

## 2023-02-01 NOTE — Assessment & Plan Note (Signed)
Follow-up with vascular surgery -last US showed  4.8 cm AAA His current symptoms are unlikely related to AAA as he has focal tenderness in the left upper quadrant area

## 2023-02-02 ENCOUNTER — Ambulatory Visit: Payer: Medicare HMO | Admitting: Gastroenterology

## 2023-02-02 ENCOUNTER — Telehealth: Payer: Self-pay | Admitting: *Deleted

## 2023-02-02 ENCOUNTER — Other Ambulatory Visit: Payer: Self-pay | Admitting: Internal Medicine

## 2023-02-02 ENCOUNTER — Encounter: Payer: Self-pay | Admitting: Gastroenterology

## 2023-02-02 VITALS — BP 124/79 | HR 76 | Temp 98.1°F | Ht 70.0 in | Wt 204.6 lb

## 2023-02-02 DIAGNOSIS — D72829 Elevated white blood cell count, unspecified: Secondary | ICD-10-CM | POA: Insufficient documentation

## 2023-02-02 DIAGNOSIS — R1012 Left upper quadrant pain: Secondary | ICD-10-CM | POA: Diagnosis not present

## 2023-02-02 DIAGNOSIS — E1169 Type 2 diabetes mellitus with other specified complication: Secondary | ICD-10-CM | POA: Insufficient documentation

## 2023-02-02 DIAGNOSIS — E119 Type 2 diabetes mellitus without complications: Secondary | ICD-10-CM | POA: Insufficient documentation

## 2023-02-02 LAB — CBC WITH DIFFERENTIAL/PLATELET
Basophils Absolute: 0.1 10*3/uL (ref 0.0–0.2)
Basos: 1 %
EOS (ABSOLUTE): 0.3 10*3/uL (ref 0.0–0.4)
Eos: 2 %
Hematocrit: 47.3 % (ref 37.5–51.0)
Hemoglobin: 14.9 g/dL (ref 13.0–17.7)
Immature Grans (Abs): 0 10*3/uL (ref 0.0–0.1)
Immature Granulocytes: 0 %
Lymphocytes Absolute: 2.6 10*3/uL (ref 0.7–3.1)
Lymphs: 23 %
MCH: 28.9 pg (ref 26.6–33.0)
MCHC: 31.5 g/dL (ref 31.5–35.7)
MCV: 92 fL (ref 79–97)
Monocytes Absolute: 0.9 10*3/uL (ref 0.1–0.9)
Monocytes: 8 %
Neutrophils Absolute: 7.4 10*3/uL — ABNORMAL HIGH (ref 1.4–7.0)
Neutrophils: 66 %
Platelets: 244 10*3/uL (ref 150–450)
RBC: 5.16 x10E6/uL (ref 4.14–5.80)
RDW: 14.6 % (ref 11.6–15.4)
WBC: 11.3 10*3/uL — ABNORMAL HIGH (ref 3.4–10.8)

## 2023-02-02 LAB — CMP14+EGFR
ALT: 7 [IU]/L (ref 0–44)
AST: 17 [IU]/L (ref 0–40)
Albumin: 4.4 g/dL (ref 3.8–4.8)
Alkaline Phosphatase: 67 [IU]/L (ref 44–121)
BUN/Creatinine Ratio: 11 (ref 10–24)
BUN: 13 mg/dL (ref 8–27)
Bilirubin Total: 0.5 mg/dL (ref 0.0–1.2)
CO2: 24 mmol/L (ref 20–29)
Calcium: 9.1 mg/dL (ref 8.6–10.2)
Chloride: 100 mmol/L (ref 96–106)
Creatinine, Ser: 1.17 mg/dL (ref 0.76–1.27)
Globulin, Total: 2.7 g/dL (ref 1.5–4.5)
Glucose: 127 mg/dL — ABNORMAL HIGH (ref 70–99)
Potassium: 4.5 mmol/L (ref 3.5–5.2)
Sodium: 140 mmol/L (ref 134–144)
Total Protein: 7.1 g/dL (ref 6.0–8.5)
eGFR: 66 mL/min/{1.73_m2} (ref >59)

## 2023-02-02 LAB — TSH: TSH: 1.55 u[IU]/mL (ref 0.450–4.500)

## 2023-02-02 LAB — IRON,TIBC AND FERRITIN PANEL
Ferritin: 60 ng/mL (ref 30–400)
Iron Saturation: 26 % (ref 15–55)
Iron: 84 ug/dL (ref 38–169)
Total Iron Binding Capacity: 323 ug/dL (ref 250–450)
UIBC: 239 ug/dL (ref 111–343)

## 2023-02-02 LAB — HEMOGLOBIN A1C
Est. average glucose Bld gHb Est-mCnc: 166 mg/dL
Hgb A1c MFr Bld: 7.4 % — ABNORMAL HIGH (ref 4.8–5.6)

## 2023-02-02 LAB — PSA: Prostate Specific Ag, Serum: 1.2 ng/mL (ref 0.0–4.0)

## 2023-02-02 LAB — VITAMIN D 25 HYDROXY (VIT D DEFICIENCY, FRACTURES): Vit D, 25-Hydroxy: 36.8 ng/mL (ref 30.0–100.0)

## 2023-02-02 MED ORDER — METFORMIN HCL ER 500 MG PO TB24: 500.0000 mg | ORAL_TABLET | Freq: Every day | ORAL | 3 refills | Status: DC

## 2023-02-02 NOTE — Patient Instructions (Signed)
Complete CT scan of your abdomen as scheduled. We will be in touch with results as available.

## 2023-02-02 NOTE — Progress Notes (Signed)
GI Office Note    Referring Provider: Anabel Halon, MD Primary Care Physician:  Anabel Halon, MD  Primary Gastroenterologist: Hennie Duos. Marletta Lor, DO   Chief Complaint   Chief Complaint  Patient presents with   Abdominal Pain    Pt is having upper left quadrant pain.    History of Present Illness   Jeremy Jenkins is a 72 y.o. male presenting today for same day visit.  Patient seen in June 2024.  History of rectal bleeding with anemia earlier this year requiring hospitalization and blood transfusion.  Hemoglobin dropped from 12.6 down to 8.9.  EGD and colonoscopy completed without obvious source for bleeding.  Diverticular bleed not excluded.  Patient on both aspirin and Plavix at that time, noting that cardiology previously recommended he stop aspirin but he did not tolerate it because of worsening angina.  Also with IDA noted earlier in 2023 which improved with oral iron supplement.   Labs from yesterday showing hemoglobin of 14.9, hematocrit 47.3, white blood cell count 11,300, MCV 92, platelets 244,000, iron 84, iron sat 26%, TIBC 323, ferritin 60, creatinine 1.17, albumin 4.4, total bilirubin 0.5, alkaline phosphatase 67, AST 17, ALT 7, A1c 7.4, TSH 1.550.  Today: One month LUQ pain, notes postprandially.  Does not seem to matter when he eats, typically not as bad with a small snack, worse with meals. No heartburn. No N/V.  Symptoms daily.  At times worse than others.  Sometimes sitting straight up or stretching out helps provide relief.  No nocturnal symptoms.  No prior history of pancreatitis or diverticulitis.  Patient has a history of drinking 5-6 alcoholic beverages per week however decided for the month of October not to drink any alcohol.  Bowel movements normal.  No melena or rectal bleeding.  EGD Aug 29, 2022: -Small hiatal hernia -Normal stomach -Normal duodenal bulb, first portion of duodenum and second portion of duodenum.   Colonoscopy Aug 29, 2022: -Nonbleeding internal hemorrhoids -Diverticulosis -Distal ileum normal   Medications   Current Outpatient Medications  Medication Sig Dispense Refill   aspirin EC 81 MG tablet Take 1 tablet (81 mg total) by mouth daily.     Azelastine HCl 137 MCG/SPRAY SOLN USE 2 SPRAY(S) IN EACH NOSTRIL TWICE DAILY AS DIRECTED 30 mL 0   benzonatate (TESSALON) 200 MG capsule Take 1 capsule (200 mg total) by mouth 2 (two) times daily as needed for cough. 30 capsule 0   Coenzyme Q10 (CO Q 10 PO) Take by mouth.     ezetimibe (ZETIA) 10 MG tablet Take 1 tablet (10 mg total) by mouth daily. 30 tablet 6   FIBER COMPLETE PO Take 2 tablets by mouth daily.     fluticasone (FLONASE) 50 MCG/ACT nasal spray Use 1 spray(s) in each nostril once daily 16 g 0   Melatonin 10 MG TABS Take 10 mg by mouth at bedtime.     metFORMIN (GLUCOPHAGE-XR) 500 MG 24 hr tablet Take 1 tablet (500 mg total) by mouth daily with breakfast. 30 tablet 3   metoprolol succinate (TOPROL-XL) 50 MG 24 hr tablet TAKE 1 TABLET BY MOUTH ONCE DAILY WITH OR IMMEDIATELY FOLLOWING A MEAL 90 tablet 0   montelukast (SINGULAIR) 10 MG tablet Take 1 tablet (10 mg total) by mouth at bedtime. 30 tablet 5   nitroGLYCERIN (NITROSTAT) 0.4 MG SL tablet Place 1 tablet (0.4 mg total) under the tongue every 5 (five) minutes as needed for chest pain. 25 tablet 2  Omega-3 Fatty Acids (FISH OIL) 1000 MG CPDR Take 1 tablet by mouth 2 (two) times daily.     pantoprazole (PROTONIX) 40 MG tablet Take 1 tablet (40 mg total) by mouth 2 (two) times daily. 180 tablet 3   rosuvastatin (CRESTOR) 40 MG tablet Take 1 tablet by mouth once daily 90 tablet 3   sucralfate (CARAFATE) 1 g tablet Take 1 tablet (1 g total) by mouth 2 (two) times daily. 30 tablet 0   traMADol (ULTRAM) 50 MG tablet Take 1 tablet (50 mg total) by mouth every 12 (twelve) hours as needed. 14 tablet 0   traZODone (DESYREL) 50 MG tablet TAKE 1 TABLET BY MOUTH AT BEDTIME 90 tablet 0   Vitamin D-Vitamin K  (VITAMIN K2-VITAMIN D3 PO) Take by mouth.     No current facility-administered medications for this visit.    Allergies   Allergies as of 02/02/2023 - Review Complete 02/02/2023  Allergen Reaction Noted   Albuterol Anaphylaxis 07/15/2016   Azithromycin Tinitus 03/07/2014   Penicillins Shortness Of Breath 02/11/2011   Tadalafil Shortness Of Breath 04/03/2021   Aspartame and phenylalanine Other (See Comments) 02/11/2011   Topiramate Other (See Comments) 04/08/2011   Buspirone Other (See Comments) 09/09/2011   Hydrocodone Other (See Comments) 07/12/2011         Review of Systems   General: Negative for anorexia, weight loss, fever, chills, fatigue, weakness. ENT: Negative for hoarseness, difficulty swallowing , nasal congestion. CV: Negative for chest pain, angina, palpitations, dyspnea on exertion, peripheral edema.  Respiratory: Negative for dyspnea at rest, dyspnea on exertion, cough, sputum, wheezing.  GI: See history of present illness. GU:  Negative for dysuria, hematuria, urinary incontinence, urinary frequency, nocturnal urination.  Endo: Negative for unusual weight change.     Physical Exam   BP 124/79 (BP Location: Right Arm, Patient Position: Sitting, Cuff Size: Normal)   Pulse 76   Temp 98.1 F (36.7 C) (Oral)   Ht 5\' 10"  (1.778 m)   Wt 204 lb 9.6 oz (92.8 kg)   SpO2 98%   BMI 29.36 kg/m    General: Well-nourished, well-developed in no acute distress.  Eyes: No icterus. Mouth: Oropharyngeal mucosa moist and pink    Abdomen: Bowel sounds are normal,  nondistended, no hepatosplenomegaly or masses,  no abdominal bruits or hernia , no rebound or guarding. Moderate luq tenderness Rectal: not performed  Extremities: No lower extremity edema. No clubbing or deformities. Neuro: Alert and oriented x 4   Skin: Warm and dry, no jaundice.   Psych: Alert and cooperative, normal mood and affect.  Labs   Lab Results  Component Value Date   NA 140 02/01/2023   CL 100  02/01/2023   K 4.5 02/01/2023   CO2 24 02/01/2023   BUN 13 02/01/2023   CREATININE 1.17 02/01/2023   EGFR 66 02/01/2023   CALCIUM 9.1 02/01/2023   ALBUMIN 4.4 02/01/2023   GLUCOSE 127 (H) 02/01/2023   Lab Results  Component Value Date   ALT 7 02/01/2023   AST 17 02/01/2023   ALKPHOS 67 02/01/2023   BILITOT 0.5 02/01/2023   Lab Results  Component Value Date   WBC 11.3 (H) 02/01/2023   HGB 14.9 02/01/2023   HCT 47.3 02/01/2023   MCV 92 02/01/2023   PLT 244 02/01/2023   Lab Results  Component Value Date   TSH 1.550 02/01/2023   Lab Results  Component Value Date   HGBA1C 7.4 (H) 02/01/2023    Imaging Studies  No results found.  Assessment/Plan:   LUQ pain -recent onset over the past one month, occurring daily, worse with meals -leukocytosis on yesterday's labs. LFTs normal. No lipase completed. -?gastritis, pancreatitis, diverticulitis (had diverticulosis of descending colon), less likely malignancy. Unlikely to be related to AAA.  -CT A/P with contrast -continue PPI.   Leanna Battles. Melvyn Neth, MHS, PA-C [facility]

## 2023-02-02 NOTE — Telephone Encounter (Signed)
Authorization Number: [id number] Case Number: [id number] Review Date: 02/02/2023 3:01:11 PM Expiration Date: 08/01/2023 Status: Your case has been Approved.

## 2023-02-03 ENCOUNTER — Ambulatory Visit (HOSPITAL_COMMUNITY)
Admission: RE | Admit: 2023-02-03 | Discharge: 2023-02-03 | Disposition: A | Discharge status: Home / Self Care | Payer: Medicare HMO | Source: Ambulatory Visit | Attending: Gastroenterology | Admitting: Gastroenterology

## 2023-02-03 DIAGNOSIS — K5730 Diverticulosis of large intestine without perforation or abscess without bleeding: Secondary | ICD-10-CM | POA: Diagnosis not present

## 2023-02-03 DIAGNOSIS — I7143 Infrarenal abdominal aortic aneurysm, without rupture: Secondary | ICD-10-CM | POA: Diagnosis not present

## 2023-02-03 DIAGNOSIS — R1012 Left upper quadrant pain: Secondary | ICD-10-CM | POA: Insufficient documentation

## 2023-02-03 DIAGNOSIS — D72829 Elevated white blood cell count, unspecified: Secondary | ICD-10-CM | POA: Insufficient documentation

## 2023-02-03 DIAGNOSIS — N400 Enlarged prostate without lower urinary tract symptoms: Secondary | ICD-10-CM | POA: Diagnosis not present

## 2023-02-03 DIAGNOSIS — K8020 Calculus of gallbladder without cholecystitis without obstruction: Secondary | ICD-10-CM | POA: Diagnosis not present

## 2023-02-03 MED ORDER — IOHEXOL 9 MG/ML PO SOLN
ORAL | Status: AC
  Filled 2023-02-03: qty 1000

## 2023-02-03 MED ORDER — IOHEXOL 9 MG/ML PO SOLN
1000.0000 mL | ORAL | Status: AC
  Administered 2023-02-03 (×2): 1000 mL via ORAL

## 2023-02-03 MED ORDER — IOHEXOL 300 MG/ML  SOLN
100.0000 mL | Freq: Once | INTRAMUSCULAR | Status: AC | PRN
  Administered 2023-02-03: 100 mL via INTRAVENOUS

## 2023-02-06 ENCOUNTER — Other Ambulatory Visit: Payer: Self-pay | Admitting: Internal Medicine

## 2023-03-01 ENCOUNTER — Other Ambulatory Visit: Payer: Self-pay | Admitting: Internal Medicine

## 2023-03-01 DIAGNOSIS — J309 Allergic rhinitis, unspecified: Secondary | ICD-10-CM

## 2023-03-04 ENCOUNTER — Other Ambulatory Visit: Payer: Self-pay | Admitting: Internal Medicine

## 2023-03-04 DIAGNOSIS — F5101 Primary insomnia: Secondary | ICD-10-CM

## 2023-03-06 ENCOUNTER — Other Ambulatory Visit: Payer: Self-pay | Admitting: Internal Medicine

## 2023-03-06 DIAGNOSIS — J309 Allergic rhinitis, unspecified: Secondary | ICD-10-CM

## 2023-03-10 ENCOUNTER — Other Ambulatory Visit: Payer: Self-pay | Admitting: Cardiology

## 2023-03-10 DIAGNOSIS — K219 Gastro-esophageal reflux disease without esophagitis: Secondary | ICD-10-CM

## 2023-03-14 ENCOUNTER — Other Ambulatory Visit: Payer: Self-pay | Admitting: Internal Medicine

## 2023-04-04 ENCOUNTER — Encounter: Payer: Self-pay | Admitting: Cardiology

## 2023-04-14 DIAGNOSIS — H01004 Unspecified blepharitis left upper eyelid: Secondary | ICD-10-CM | POA: Diagnosis not present

## 2023-04-14 DIAGNOSIS — H524 Presbyopia: Secondary | ICD-10-CM | POA: Diagnosis not present

## 2023-04-14 DIAGNOSIS — H01001 Unspecified blepharitis right upper eyelid: Secondary | ICD-10-CM | POA: Diagnosis not present

## 2023-04-14 DIAGNOSIS — H01002 Unspecified blepharitis right lower eyelid: Secondary | ICD-10-CM | POA: Diagnosis not present

## 2023-04-14 DIAGNOSIS — H2513 Age-related nuclear cataract, bilateral: Secondary | ICD-10-CM | POA: Diagnosis not present

## 2023-04-21 ENCOUNTER — Ambulatory Visit: Payer: Medicare HMO | Attending: Cardiology | Admitting: Cardiology

## 2023-04-21 ENCOUNTER — Encounter: Payer: Self-pay | Admitting: Cardiology

## 2023-04-21 VITALS — BP 122/80 | HR 87 | Ht 70.0 in | Wt 200.4 lb

## 2023-04-21 DIAGNOSIS — I25118 Atherosclerotic heart disease of native coronary artery with other forms of angina pectoris: Secondary | ICD-10-CM | POA: Diagnosis not present

## 2023-04-21 DIAGNOSIS — I10 Essential (primary) hypertension: Secondary | ICD-10-CM | POA: Diagnosis not present

## 2023-04-21 DIAGNOSIS — E782 Mixed hyperlipidemia: Secondary | ICD-10-CM

## 2023-04-21 MED ORDER — NITROGLYCERIN 0.4 MG SL SUBL: 0.4000 mg | SUBLINGUAL_TABLET | SUBLINGUAL | 2 refills | Status: AC | PRN

## 2023-04-21 MED ORDER — ASPIRIN 325 MG PO TBEC: 325.0000 mg | DELAYED_RELEASE_TABLET | Freq: Every day | ORAL | Status: AC

## 2023-04-21 NOTE — Patient Instructions (Signed)
Medication Instructions:  Your physician recommends that you continue on your current medications as directed. Please refer to the Current Medication list given to you today.  *If you need a refill on your cardiac medications before your next appointment, please call your pharmacy*   Lab Work: Fasting Lipid Panel  If you have labs (blood work) drawn today and your tests are completely normal, you will receive your results only by: MyChart Message (if you have MyChart) OR A paper copy in the mail If you have any lab test that is abnormal or we need to change your treatment, we will call you to review the results.   Testing/Procedures: None   Follow-Up: At [facility], you and your health needs are our priority.  As part of our continuing mission to provide you with exceptional heart care, we have created designated Provider Care Teams.  These Care Teams include your primary Cardiologist (physician) and Advanced Practice Providers (APPs -  Physician Assistants and Nurse Practitioners) who all work together to provide you with the care you need, when you need it.  We recommend signing up for the patient portal called "MyChart".  Sign up information is provided on this After Visit Summary.  MyChart is used to connect with patients for Virtual Visits (Telemedicine).  Patients are able to view lab/test results, encounter notes, upcoming appointments, etc.  Non-urgent messages can be sent to your provider as well.   To learn more about what you can do with MyChart, go to [email or web address].    Your next appointment:   6 month(s)  Provider:   You may see Dina Rich, MD or one of the following Advanced Practice Providers on your designated Care Team:   Randall An, PA-C  Jacolyn Reedy, [place]     Other Instructions

## 2023-04-21 NOTE — Progress Notes (Signed)
Clinical Summary Jeremy Jenkins is a 73 y.o.male seen today for follow up of the following medical problems.       1.CAD - history of prior STEMI with PCI to RCA 01/2011 - admit 05/2021 with chest pain. Mild trop 55.  - 05/2021 cath: diffuse ISR RCA stent, repeated stenting of RCA. Interventional recommended DAPT x 1 year then stop ASA and continue plavix - 05/2021 echo: LVEF 55-60%, no WMAs     - plavix was stopped during 08/2022 admission with GI bleed. He was supposed to be on plavix monotherapy at the time but reported chest pain on that regimen and went back to ASA and plavix. Plavix stopped during the admission, continued on just ASA  - no recent chest pains. He restarted on ASA 325mg  on his own, reports ASA 81mg  had recurrent chest pains. . No recurrent bleeding.  - no SOB/DOE - compliant with meds       2. HTN - compliant with meds     3. Hyperlipidemia 12/2021 cholesterol too high, changed to crestor 40mg  daily - needs repeat panel  10/2022 TC 130 TG 95 HDL 47 LDL 64. Zetia 10mg  added at that time .        4. AAA - 4.8 cm by 05/2021 [place] - followed by vascular Dr Jeremy Jenkins.    06/2022 4.8 cm 01/2023 4.7 cm infrarenal      5. EtoH abuse - prior history mentioend in chart    6. GI bleed - admit 08/2022, presented with melena Hgb down to 8.7 - transfused 1 unit pRBCs - no clear cause of bleeding from endoscopy, question if diverticular bleed     SH: recent sold house, bought camper and travelled extensively. [place] for 18 months   Looking to buy a new camper    Past Medical History:  Diagnosis Date   AAA (abdominal aortic aneurysm) (HCC)    NSTEMI (non-ST elevated myocardial infarction) (HCC) 05/19/2021   Old myocardial infarction 09/11/2013   Formatting of this note might be different from the original. Overview:  01/2011 Formatting of this note might be different from the original. 01/2011     Allergies  Allergen Reactions   Albuterol Anaphylaxis    Azithromycin Tinitus    Hearing loss    Penicillins Shortness Of Breath   Tadalafil Shortness Of Breath   Aspartame And Phenylalanine Other (See Comments)    HEADACHES   Topiramate Other (See Comments)    Memory loss    Buspirone Other (See Comments)    Memory loss    Hydrocodone Other (See Comments)    Tingling all over     Current Outpatient Medications  Medication Sig Dispense Refill   aspirin EC 81 MG tablet Take 1 tablet (81 mg total) by mouth daily.     Azelastine HCl 137 MCG/SPRAY SOLN USE 2 SPRAY(S) IN EACH NOSTRIL TWICE DAILY AS DIRECTED 30 mL 0   benzonatate (TESSALON) 200 MG capsule Take 1 capsule (200 mg total) by mouth 2 (two) times daily as needed for cough. 30 capsule 0   Coenzyme Q10 (CO Q 10 PO) Take by mouth.     ezetimibe (ZETIA) 10 MG tablet Take 1 tablet (10 mg total) by mouth daily. 30 tablet 6   FIBER COMPLETE PO Take 2 tablets by mouth daily.     fluticasone (FLONASE) 50 MCG/ACT nasal spray Use 1 spray(s) in each nostril once daily 16 g 0   Melatonin 10 MG TABS Take  10 mg by mouth at bedtime.     metFORMIN (GLUCOPHAGE-XR) 500 MG 24 hr tablet Take 1 tablet (500 mg total) by mouth daily with breakfast. 30 tablet 3   metoprolol succinate (TOPROL-XL) 50 MG 24 hr tablet TAKE 1 TABLET BY MOUTH ONCE DAILY WITH  OR  IMMEDIATELY  FOLLOWING  A  MEAL 90 tablet 0   montelukast (SINGULAIR) 10 MG tablet Take 1 tablet (10 mg total) by mouth at bedtime. 30 tablet 5   nitroGLYCERIN (NITROSTAT) 0.4 MG SL tablet Place 1 tablet (0.4 mg total) under the tongue every 5 (five) minutes as needed for chest pain. 25 tablet 2   Omega-3 Fatty Acids (FISH OIL) 1000 MG CPDR Take 1 tablet by mouth 2 (two) times daily.     pantoprazole (PROTONIX) 40 MG tablet Take 1 tablet by mouth twice daily 180 tablet 0   rosuvastatin (CRESTOR) 40 MG tablet Take 1 tablet by mouth once daily 90 tablet 3   sucralfate (CARAFATE) 1 g tablet Take 1 tablet (1 g total) by mouth 2 (two) times daily. 30  tablet 0   traMADol (ULTRAM) 50 MG tablet Take 1 tablet (50 mg total) by mouth every 12 (twelve) hours as needed. 14 tablet 0   traZODone (DESYREL) 50 MG tablet TAKE 1 TABLET BY MOUTH AT BEDTIME 90 tablet 0   Vitamin D-Vitamin K (VITAMIN K2-VITAMIN D3 PO) Take by mouth.     No current facility-administered medications for this visit.     Past Surgical History:  Procedure Laterality Date   CARDIAC CATHETERIZATION     COLONOSCOPY WITH PROPOFOL N/A 08/29/2022   Procedure: COLONOSCOPY WITH PROPOFOL;  Surgeon: Lanelle Bal, DO;  Location: [facility];  Service: Endoscopy;  Laterality: N/A;   CORONARY STENT INTERVENTION N/A 05/19/2021   Procedure: CORONARY STENT INTERVENTION;  Surgeon: Lyn Records, MD;  Location: [facility];  Service: Cardiovascular;  Laterality: N/A;   ESOPHAGOGASTRODUODENOSCOPY (EGD) WITH PROPOFOL N/A 08/29/2022   Procedure: ESOPHAGOGASTRODUODENOSCOPY (EGD) WITH PROPOFOL;  Surgeon: Lanelle Bal, DO;  Location: [facility];  Service: Endoscopy;  Laterality: N/A;   LEFT HEART CATH AND CORONARY ANGIOGRAPHY N/A 05/19/2021   Procedure: LEFT HEART CATH AND CORONARY ANGIOGRAPHY;  Surgeon: Lyn Records, MD;  Location: [facility];  Service: Cardiovascular;  Laterality: N/A;     Allergies  Allergen Reactions   Albuterol Anaphylaxis   Azithromycin Tinitus    Hearing loss    Penicillins Shortness Of Breath   Tadalafil Shortness Of Breath   Aspartame And Phenylalanine Other (See Comments)    HEADACHES   Topiramate Other (See Comments)    Memory loss    Buspirone Other (See Comments)    Memory loss    Hydrocodone Other (See Comments)    Tingling all over      Family History  Problem Relation Age of Onset   Sudden Cardiac Death Maternal Uncle      Social History Jeremy Jenkins reports that he quit smoking about 4 years ago. His smoking use included cigarettes. He has never used smokeless tobacco. Jeremy Jenkins reports that he does not  currently use alcohol.   Review of Systems CONSTITUTIONAL: No weight loss, fever, chills, weakness or fatigue.  HEENT: Eyes: No visual loss, blurred vision, double vision or yellow sclerae.No hearing loss, sneezing, congestion, runny nose or sore throat.  SKIN: No rash or itching.  CARDIOVASCULAR: per hpi RESPIRATORY: No shortness of breath, cough or sputum.  GASTROINTESTINAL: No anorexia,  nausea, vomiting or diarrhea. No abdominal pain or blood.  GENITOURINARY: No burning on urination, no polyuria NEUROLOGICAL: No headache, dizziness, syncope, paralysis, ataxia, numbness or tingling in the extremities. No change in bowel or bladder control.  MUSCULOSKELETAL: No muscle, back pain, joint pain or stiffness.  LYMPHATICS: No enlarged nodes. No history of splenectomy.  PSYCHIATRIC: No history of depression or anxiety.  ENDOCRINOLOGIC: No reports of sweating, cold or heat intolerance. No polyuria or polydipsia.  Marland Kitchen   Physical Examination Today's Vitals   04/21/23 1600  BP: 122/80  Pulse: 87  SpO2: 97%  Weight: 200 lb 6.4 oz (90.9 kg)  Height: 5\' 10"  (1.778 m)  PainSc: 0-No pain   Body mass index is 28.75 kg/m.  Gen: resting comfortably, no acute distress HEENT: no scleral icterus, pupils equal round and reactive, no palptable cervical adenopathy,  CV: RRR, no m/rg, no jvd Resp: Clear to auscultation bilaterally GI: abdomen is soft, non-tender, non-distended, normal bowel sounds, no hepatosplenomegaly MSK: extremities are warm, no edema.  Skin: warm, no rash Neuro:  no focal deficits Psych: appropriate affect   Diagnostic Studies  Abd U/S 05/06/21:   Summary:  Abdominal Aorta: There is evidence of abnormal dilatation of the mid  Abdominal aorta. The largest aortic diameter has increased compared to  prior exam. Previous diameter measurement was 3.8 cm obtained on 08/21/17  by CT[facility].      TTE 08/22/17:   Interpretation Summary  A complete portable two-dimensional  transthoracic echocardiogram with color  flow Doppler and Spectral Doppler was performed. The left ventricle is  normal in size.  There is mild concentric left ventricular hypertrophy.  The left ventricular ejection fraction is normal (60-65%).  The left ventricular wall motion is normal.  Mild aortic sclerosis is present with good valvular opening.  There is mild aortic root dilatation.  The aortic valve is trileaflet.  Grade I mild diastolic dysfunction; abnormal relaxation pattern.  Estimation of right ventricular systolic pressure is not possible.      LHC 01/16/11:   CONCLUSIONS:  1.  Single vessel obstructive coronary artery disease in the setting of an  ST elevation myocardial infarction.  2.  Status post PCI to the proximal and mid RCA with a 3.0 x 28 and 3.5 x  12 mm vision bare-metal stents.  Lesion length was 30 mm, eccentric, mild  calcium, definite thrombus.  Lesion went from a 100% pre to 0% post  residual.  Pre-TIMI flow was 0, post-TIMI flow was 3.  3.  Right radial access.  4.  Preserved left ventricular function.    05/2021 cath Diffuse in-stent restenosis proximal to mid RCA with worse region up to 95%. Angioplasty/scoring balloon followed by restenting starting beyond the margin of the previously placed stent and extending back to the proximal segment reducing to 0% stenosis with TIMI grade III flow. Widely patent left main LAD beyond the newly placed stent contains eccentric 60% stenosis and tandem 30 to 40% stenoses in the distal segment. 50 to 60% mid circumflex. LV function is normal.  LVEDP 11 mmHg.   RECOMMENDATIONS:   Dual antiplatelet therapy for 1 year then drop aspirin and continue clopidogrel indefinitely. Aggressive risk factor modification with LDL lowering to less than 55. Eligible for discharge in a.m. unless recurrent symptoms.     05/2021 echo  IMPRESSIONS     1. Left ventricular ejection fraction, by estimation, is 55 to 60%. The  left  ventricle has normal function. The left ventricle has no regional  wall motion abnormalities. Left ventricular diastolic parameters were  normal.   2. Right ventricular systolic function is normal. The right ventricular  size is normal. There is normal pulmonary artery systolic pressure. The  estimated right ventricular systolic pressure is 32.2 mmHg.   3. The mitral valve is grossly normal. No evidence of mitral valve  regurgitation. No evidence of mitral stenosis.   4. The aortic valve is tricuspid. Aortic valve regurgitation is trivial.  No aortic stenosis is present.   5. The inferior vena cava is normal in size with <50% respiratory  variability, suggesting right atrial pressure of 8 mmHg.              Assessment and Plan   1.CAD with stable angina - recent stenting as reported above - prior chest pains resovled on imdur - he prefers taking ASA 325mg  daily, reports recurrent chest pains on ASA 81mg . Off plavix which was stopped after a GI bleed  - continue current meds    2. Hyperlipidemia -started zetia few months ago added to his crestor 40mg , will repeat lipid panel - goal LDL would be <55   3. HTN -his bp is at goal, continue current meds  F/u 6 months     Antoine Poche, M.D.

## 2023-04-30 ENCOUNTER — Other Ambulatory Visit: Payer: Self-pay | Admitting: Internal Medicine

## 2023-04-30 DIAGNOSIS — M51369 Other intervertebral disc degeneration, lumbar region without mention of lumbar back pain or lower extremity pain: Secondary | ICD-10-CM

## 2023-05-04 ENCOUNTER — Ambulatory Visit (INDEPENDENT_AMBULATORY_CARE_PROVIDER_SITE_OTHER): Payer: Medicare HMO | Admitting: Podiatry

## 2023-05-04 ENCOUNTER — Encounter: Payer: Self-pay | Admitting: Podiatry

## 2023-05-04 ENCOUNTER — Ambulatory Visit (INDEPENDENT_AMBULATORY_CARE_PROVIDER_SITE_OTHER): Payer: Medicare HMO | Admitting: Internal Medicine

## 2023-05-04 ENCOUNTER — Encounter: Payer: Self-pay | Admitting: Internal Medicine

## 2023-05-04 VITALS — BP 109/70 | HR 70 | Ht 70.0 in | Wt 200.8 lb

## 2023-05-04 DIAGNOSIS — E782 Mixed hyperlipidemia: Secondary | ICD-10-CM | POA: Diagnosis not present

## 2023-05-04 DIAGNOSIS — J309 Allergic rhinitis, unspecified: Secondary | ICD-10-CM | POA: Diagnosis not present

## 2023-05-04 DIAGNOSIS — F5104 Psychophysiologic insomnia: Secondary | ICD-10-CM

## 2023-05-04 DIAGNOSIS — Z7984 Long term (current) use of oral hypoglycemic drugs: Secondary | ICD-10-CM

## 2023-05-04 DIAGNOSIS — M79675 Pain in left toe(s): Secondary | ICD-10-CM | POA: Diagnosis not present

## 2023-05-04 DIAGNOSIS — L600 Ingrowing nail: Secondary | ICD-10-CM | POA: Diagnosis not present

## 2023-05-04 DIAGNOSIS — E1169 Type 2 diabetes mellitus with other specified complication: Secondary | ICD-10-CM

## 2023-05-04 DIAGNOSIS — M79674 Pain in right toe(s): Secondary | ICD-10-CM | POA: Diagnosis not present

## 2023-05-04 DIAGNOSIS — B351 Tinea unguium: Secondary | ICD-10-CM

## 2023-05-04 DIAGNOSIS — E119 Type 2 diabetes mellitus without complications: Secondary | ICD-10-CM | POA: Diagnosis not present

## 2023-05-04 DIAGNOSIS — Z0001 Encounter for general adult medical examination with abnormal findings: Secondary | ICD-10-CM

## 2023-05-04 DIAGNOSIS — I7143 Infrarenal abdominal aortic aneurysm, without rupture: Secondary | ICD-10-CM | POA: Diagnosis not present

## 2023-05-04 MED ORDER — LANCET DEVICE MISC: 1.0000 | Freq: Three times a day (TID) | 0 refills | Status: AC

## 2023-05-04 MED ORDER — BLOOD GLUCOSE TEST VI STRP: 1.0000 | ORAL_STRIP | Freq: Three times a day (TID) | 2 refills | Status: AC

## 2023-05-04 MED ORDER — LANCETS MISC. MISC: 1.0000 | Freq: Three times a day (TID) | 2 refills | Status: AC

## 2023-05-04 MED ORDER — BLOOD GLUCOSE MONITORING SUPPL DEVI: 1.0000 | Freq: Three times a day (TID) | 0 refills | Status: AC

## 2023-05-04 NOTE — Patient Instructions (Signed)
Please continue to take medications as prescribed.  Please continue to follow low carb diet and perform moderate exercise/walking at least 150 mins/week.

## 2023-05-04 NOTE — Assessment & Plan Note (Addendum)
Follow-up with vascular surgery -last CT abdomen showed  4.7 cm AAA, stable in size

## 2023-05-04 NOTE — Assessment & Plan Note (Signed)
On statin and Zetia Check lipid profile

## 2023-05-04 NOTE — Assessment & Plan Note (Signed)
Uses Flonase Better with Singulair and azelastine nasal spray Had referred to ENT specialist - may need repeat evaluation if persistent throat irritation

## 2023-05-04 NOTE — Progress Notes (Signed)
Subjective:   Patient ID: Jeremy Jenkins, male   DOB: 73 y.o.   MRN: [id number]   HPI Patient states he has been diagnosed with diabetes and was concerned about this and also has thick yellow nailbeds he cannot cut himself and make shoe gear difficult.  Patient does not smoke tries to be active   Review of Systems  All other systems reviewed and are negative.       Objective:  Physical Exam Vitals and nursing note reviewed.  Constitutional:      Appearance: He is well-developed.  Pulmonary:     Effort: Pulmonary effort is normal.  Musculoskeletal:        General: Normal range of motion.  Skin:    General: Skin is warm.  Neurological:     Mental Status: He is alert.     Neurovascular status intact with significant thickness dystrophic changes fourth nail right moderate hallux right and damaged painful left hallux nail with incurvation of the borders.  All nails are thickened dystrophic A1c is 7.4 and it is good improving all the time as he is moderated his diet and was only diagnosed 1 month ago     Assessment:  Chronic mycotic nail infection 1-5 both feet with severe nail disease fourth right hallux left over right     Plan:  H&P reviewed all conditions and I do think ultimately fourth nail right will need to be removed and probable hallux nail left.  I did educate on the procedures today organ to do aggressive debridement and I want to see the results over a month and we can make a decision on what else may appropriate with discussion about diabetes today and daily foot inspections.  No iatrogenic bleeding upon removal of nailbeds and trimming today 1-5 both feet

## 2023-05-04 NOTE — Assessment & Plan Note (Signed)
Takes trazodone 50 mg nightly as needed

## 2023-05-04 NOTE — Assessment & Plan Note (Signed)
Lab Results  Component Value Date   HGBA1C 7.4 (H) 02/01/2023   New onset On Metformin 500 mg once daily now Advised to follow diabetic diet On statin F/u CMP and lipid panel Diabetic eye exam: Advised to follow up with Ophthalmology for diabetic eye exam

## 2023-05-04 NOTE — Progress Notes (Signed)
Established Patient Office Visit  Subjective:  Patient ID: Jeremy Jenkins, male    DOB: Aug 13, 1950  Age: 73 y.o. MRN: [id number]  CC:  Chief Complaint  Patient presents with   Diabetes   Hypertension   Annual Exam    HPI Jeremy Jenkins is a 73 y.o. male with past medical history of CAD s/p stent placement, DM, HLD and aortic aneurysm who presents for annual physical.  DM: His HbA1c was 7.3 in 10/24.  He has cut down on carb intake.  He takes metformin 500 mg QD.  Denies polyuria or polyphagia currently.  His nasal congestion and postnasal drip have improved with Singulair now. He has Flonase and Astelin nasal spray as well. Denies any fever or chills currently.  GERD: He currently takes pantoprazole 40 mg twice daily.  He had an episode of rectal bleeding in 05/24, and had EGD and colonoscopy, which were unremarkable.  He currently denies any melena or hematochezia.  CAD: He has been taking aspirin and statin. He was given Imdur for anginal chest pain, but had severe headache with it and had to stop taking it.  He reports that his chest pain episodes have resolved now. He denies any dyspnea or palpitations currently. BP is wnl.   He takes trazodone as needed for insomnia.  He has talked to [facility] therapist for GAD.  Denies any SI or HI currently.    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 Overview:  01/2011 Formatting of this note might be different from the original. 01/2011    Past Surgical History:  Procedure Laterality Date   CARDIAC CATHETERIZATION     COLONOSCOPY WITH PROPOFOL N/A 08/29/2022   Procedure: COLONOSCOPY WITH PROPOFOL;  Surgeon: Lanelle Bal, DO;  [facility];  Service: Endoscopy;  Laterality: N/A;   CORONARY STENT INTERVENTION N/A  05/19/2021   Procedure: CORONARY STENT INTERVENTION;  Surgeon: Lyn Records, MD;  Location: [facility];  Service: Cardiovascular;  Laterality: N/A;   ESOPHAGOGASTRODUODENOSCOPY (EGD) WITH PROPOFOL N/A 08/29/2022   Procedure: ESOPHAGOGASTRODUODENOSCOPY (EGD) WITH PROPOFOL;  Surgeon: Lanelle Bal, DO;  Location: [facility];  Service: Endoscopy;  Laterality: N/A;   LEFT HEART CATH AND CORONARY ANGIOGRAPHY N/A 05/19/2021   Procedure: LEFT HEART CATH AND CORONARY ANGIOGRAPHY;  Surgeon: Lyn Records, MD;  Location: [facility];  Service: Cardiovascular;  Laterality: N/A;    Family History  Problem Relation Age of Onset   Sudden Cardiac Death Maternal Uncle     Social History   Socioeconomic History   Marital status: Married    Spouse name: Not on file   Number of children: Not on file   Years of education: Not on file   Highest education level: Bachelor's degree (e.g., BA, AB, BS)  Occupational History   Not on file  Tobacco Use   Smoking status: Former    Current packs/day: 0.00    Types: Cigarettes    Quit date: 06/02/2018    Years since quitting: 4.9   Smokeless tobacco: Never  Vaping Use   Vaping status: Never Used  Substance and Sexual Activity   Alcohol use: Yes    Comment: rare   Drug use: Never   Sexual activity: Not on file  Other Topics Concern  Not on file  Social History Narrative   Not on file   Social Drivers of Health   Financial Resource Strain: Low Risk  (02/01/2023)   Overall Financial Resource Strain (CARDIA)    Difficulty of Paying Living Expenses: Not hard at all  Food Insecurity: No Food Insecurity (02/01/2023)   Hunger Vital Sign    Worried About Running Out of Food in the Last Year: Never true    Ran Out of Food in the Last Year: Never true  Transportation Needs: No Transportation Needs (02/01/2023)   PRAPARE - Administrator, Civil Service (Medical): No    Lack of Transportation (Non-Medical): No  Physical  Activity: Unknown (02/01/2023)   Exercise Vital Sign    Days of Exercise per Week: 0 days    Minutes of Exercise per Session: Not on file  Stress: No Stress Concern Present (02/01/2023)   [employer] of Occupational Health - Occupational Stress Questionnaire    Feeling of Stress : Only a little  Social Connections: Socially Isolated (02/01/2023)   Social Connection and Isolation Panel [NHANES]    Frequency of Communication with Friends and Family: Never    Frequency of Social Gatherings with Friends and Family: Never    Attends Religious Services: Never    Database administrator or Organizations: No    Attends Engineer, structural: Not on file    Marital Status: Married  Catering manager Violence: Not At Risk (08/27/2022)   Humiliation, Afraid, Rape, and Kick questionnaire    Fear of Current or Ex-Partner: No    Emotionally Abused: No    Physically Abused: No    Sexually Abused: No    Outpatient Medications Prior to Visit  Medication Sig Dispense Refill   aspirin EC 325 MG tablet Take 1 tablet (325 mg total) by mouth daily.     Azelastine HCl 137 MCG/SPRAY SOLN USE 2 SPRAY(S) IN EACH NOSTRIL TWICE DAILY AS DIRECTED 30 mL 0   Coenzyme Q10 (CO Q 10 PO) Take by mouth.     FIBER COMPLETE PO Take 2 tablets by mouth daily.     fluticasone (FLONASE) 50 MCG/ACT nasal spray Use 1 spray(s) in each nostril once daily 16 g 0   Melatonin 10 MG TABS Take 10 mg by mouth at bedtime.     metFORMIN (GLUCOPHAGE-XR) 500 MG 24 hr tablet Take 1 tablet (500 mg total) by mouth daily with breakfast. 30 tablet 3   metoprolol succinate (TOPROL-XL) 50 MG 24 hr tablet TAKE 1 TABLET BY MOUTH ONCE DAILY WITH  OR  IMMEDIATELY  FOLLOWING  A  MEAL 90 tablet 0   montelukast (SINGULAIR) 10 MG tablet Take 1 tablet (10 mg total) by mouth at bedtime. 30 tablet 5   nitroGLYCERIN (NITROSTAT) 0.4 MG SL tablet Place 1 tablet (0.4 mg total) under the tongue every 5 (five) minutes as needed for chest pain. 25  tablet 2   Omega-3 Fatty Acids (FISH OIL) 1000 MG CPDR Take 1 tablet by mouth 2 (two) times daily.     pantoprazole (PROTONIX) 40 MG tablet Take 1 tablet by mouth twice daily (Patient taking differently: Take 40 mg by mouth daily.) 180 tablet 0   rosuvastatin (CRESTOR) 40 MG tablet Take 1 tablet by mouth once daily 90 tablet 3   sucralfate (CARAFATE) 1 g tablet Take 1 tablet (1 g total) by mouth 2 (two) times daily. 30 tablet 0   traMADol (ULTRAM) 50 MG tablet TAKE 1 TABLET BY  MOUTH EVERY 12 HOURS AS NEEDED 14 tablet 0   traZODone (DESYREL) 50 MG tablet TAKE 1 TABLET BY MOUTH AT BEDTIME 90 tablet 0   Vitamin D-Vitamin K (VITAMIN K2-VITAMIN D3 PO) Take by mouth.     benzonatate (TESSALON) 200 MG capsule Take 1 capsule (200 mg total) by mouth 2 (two) times daily as needed for cough. 30 capsule 0   ezetimibe (ZETIA) 10 MG tablet Take 1 tablet (10 mg total) by mouth daily. 30 tablet 6   No facility-administered medications prior to visit.    Allergies  Allergen Reactions   Albuterol Anaphylaxis   Azithromycin Tinitus    Hearing loss    Penicillins Shortness Of Breath   Tadalafil Shortness Of Breath   Aspartame And Phenylalanine Other (See Comments)    HEADACHES   Topiramate Other (See Comments)    Memory loss    Buspirone Other (See Comments)    Memory loss    Hydrocodone Other (See Comments)    Tingling all over    ROS Review of Systems  Constitutional:  Negative for chills and fever.  HENT:  Positive for congestion and tinnitus.   Eyes:  Negative for pain and discharge.  Respiratory:  Negative for cough and shortness of breath.   Cardiovascular:  Negative for chest pain and palpitations.  Gastrointestinal:  Negative for diarrhea, nausea and vomiting.  Endocrine: Negative for polydipsia and polyuria.  Genitourinary:  Negative for dysuria and hematuria.  Musculoskeletal:  Positive for back pain. Negative for neck pain and neck stiffness.  Skin:  Negative for rash.       Mole  on back  Neurological:  Negative for dizziness, weakness, numbness and headaches.  Psychiatric/Behavioral:  Positive for sleep disturbance. Negative for agitation and behavioral problems.       Objective:    Physical Exam Vitals reviewed.  Constitutional:      General: He is not in acute distress.    Appearance: He is not diaphoretic.  HENT:     Head: Normocephalic and atraumatic.     Nose: Congestion present.     Mouth/Throat:     Mouth: Mucous membranes are moist.     Pharynx: No posterior oropharyngeal erythema.  Eyes:     General: No scleral icterus.    Extraocular Movements: Extraocular movements intact.  Cardiovascular:     Rate and Rhythm: Normal rate and regular rhythm.     Heart sounds: Normal heart sounds. No murmur heard. Pulmonary:     Breath sounds: Normal breath sounds. No wheezing or rales.  Abdominal:     Palpations: Abdomen is soft.     Tenderness: There is no abdominal tenderness.  Musculoskeletal:     Cervical back: Neck supple. No tenderness.     Right lower leg: No edema.     Left lower leg: No edema.  Skin:    General: Skin is warm.     Findings: No lesion.  Neurological:     General: No focal deficit present.     Mental Status: He is alert and oriented to person, place, and time.     Sensory: No sensory deficit.     Motor: No weakness.  Psychiatric:        Mood and Affect: Mood normal.        Behavior: Behavior normal.     BP 109/70   Pulse 70   Ht 5\' 10"  (1.778 m)   Wt 200 lb 12.8 oz (91.1 kg)   SpO2 97%  BMI 28.81 kg/m  Wt Readings from Last 3 Encounters:  05/04/23 200 lb 12.8 oz (91.1 kg)  04/21/23 200 lb 6.4 oz (90.9 kg)  02/02/23 204 lb 9.6 oz (92.8 kg)    Lab Results  Component Value Date   TSH 1.550 02/01/2023   Lab Results  Component Value Date   WBC 11.3 (H) 02/01/2023   HGB 14.9 02/01/2023   HCT 47.3 02/01/2023   MCV 92 02/01/2023   PLT 244 02/01/2023   Lab Results  Component Value Date   NA 140 02/01/2023    K 4.5 02/01/2023   CO2 24 02/01/2023   GLUCOSE 127 (H) 02/01/2023   BUN 13 02/01/2023   CREATININE 1.17 02/01/2023   BILITOT 0.5 02/01/2023   ALKPHOS 67 02/01/2023   AST 17 02/01/2023   ALT 7 02/01/2023   PROT 7.1 02/01/2023   ALBUMIN 4.4 02/01/2023   CALCIUM 9.1 02/01/2023   ANIONGAP 9 08/28/2022   EGFR 66 02/01/2023   Lab Results  Component Value Date   CHOL 130 10/28/2022   Lab Results  Component Value Date   HDL 47 10/28/2022   Lab Results  Component Value Date   LDLCALC 64 10/28/2022   Lab Results  Component Value Date   TRIG 95 10/28/2022   Lab Results  Component Value Date   CHOLHDL 2.8 10/28/2022   Lab Results  Component Value Date   HGBA1C 7.4 (H) 02/01/2023      Assessment & Plan:   Problem List Items Addressed This Visit       Cardiovascular and Mediastinum   Infrarenal abdominal aortic aneurysm (AAA) without rupture (HCC)   Follow-up with vascular surgery -last CT abdomen showed  4.7 cm AAA, stable in size        Respiratory   Allergic sinusitis   Uses Flonase Better with Singulair and azelastine nasal spray Had referred to ENT specialist - may need repeat evaluation if persistent throat irritation        Endocrine   Type 2 diabetes mellitus with other specified complication (HCC)   Lab Results  Component Value Date   HGBA1C 7.4 (H) 02/01/2023   New onset On Metformin 500 mg once daily now Advised to follow diabetic diet On statin F/u CMP and lipid panel Diabetic eye exam: Advised to follow up with Ophthalmology for diabetic eye exam      Relevant Medications   Blood Glucose Monitoring Suppl DEVI   Glucose Blood (BLOOD GLUCOSE TEST STRIPS) STRP   Lancet Device MISC   Lancets Misc. MISC   Other Relevant Orders   CMP14+EGFR   Hemoglobin A1c   Urine Microalbumin w/creat. ratio     Other   Insomnia   Takes trazodone 50 mg nightly as needed      Mixed hyperlipidemia   On statin and Zetia Check lipid profile       Relevant Orders   Lipid Profile   Encounter for general adult medical examination with abnormal findings - Primary   Physical exam as documented. Fasting blood tests ordered. Advised to get Shingrix and Tdap vaccines at local pharmacy.        Meds ordered this encounter  Medications   Blood Glucose Monitoring Suppl DEVI    Sig: 1 each by Does not apply route in the morning, at noon, and at bedtime. May substitute to any manufacturer covered by patient's insurance.    Dispense:  1 each    Refill:  0   Glucose Blood (BLOOD GLUCOSE  TEST STRIPS) STRP    Sig: 1 each by In Vitro route in the morning, at noon, and at bedtime. May substitute to any manufacturer covered by patient's insurance.    Dispense:  100 strip    Refill:  2   Lancet Device MISC    Sig: 1 each by Does not apply route in the morning, at noon, and at bedtime. May substitute to any manufacturer covered by patient's insurance.    Dispense:  1 each    Refill:  0   Lancets Misc. MISC    Sig: 1 each by Does not apply route in the morning, at noon, and at bedtime. May substitute to any manufacturer covered by patient's insurance.    Dispense:  100 each    Refill:  2    Follow-up: Return in about 6 months (around 11/01/2023) for DM and HTN.    Anabel Halon, MD

## 2023-05-04 NOTE — Assessment & Plan Note (Signed)
Physical exam as documented. Fasting blood tests ordered. Advised to get Shingrix and Tdap vaccines at local pharmacy.

## 2023-05-05 LAB — CMP14+EGFR
ALT: 5 [IU]/L (ref 0–44)
AST: 16 [IU]/L (ref 0–40)
Albumin: 4.4 g/dL (ref 3.8–4.8)
Alkaline Phosphatase: 62 [IU]/L (ref 44–121)
BUN/Creatinine Ratio: 18 (ref 10–24)
BUN: 17 mg/dL (ref 8–27)
Bilirubin Total: 0.6 mg/dL (ref 0.0–1.2)
CO2: 25 mmol/L (ref 20–29)
Calcium: 9.3 mg/dL (ref 8.6–10.2)
Chloride: 104 mmol/L (ref 96–106)
Creatinine, Ser: 0.96 mg/dL (ref 0.76–1.27)
Globulin, Total: 2.1 g/dL (ref 1.5–4.5)
Glucose: 100 mg/dL — ABNORMAL HIGH (ref 70–99)
Potassium: 4.4 mmol/L (ref 3.5–5.2)
Sodium: 143 mmol/L (ref 134–144)
Total Protein: 6.5 g/dL (ref 6.0–8.5)
eGFR: 83 mL/min/{1.73_m2} (ref >59)

## 2023-05-05 LAB — LIPID PANEL
Chol/HDL Ratio: 2.5 {ratio} (ref 0.0–5.0)
Cholesterol, Total: 94 mg/dL — ABNORMAL LOW (ref 100–199)
HDL: 37 mg/dL — ABNORMAL LOW (ref >39)
LDL Chol Calc (NIH): 37 mg/dL (ref 0–99)
Triglycerides: 110 mg/dL (ref 0–149)
VLDL Cholesterol Cal: 20 mg/dL (ref 5–40)

## 2023-05-05 LAB — HEMOGLOBIN A1C
Est. average glucose Bld gHb Est-mCnc: 140 mg/dL
Hgb A1c MFr Bld: 6.5 % — ABNORMAL HIGH (ref 4.8–5.6)

## 2023-05-23 ENCOUNTER — Other Ambulatory Visit: Payer: Self-pay | Admitting: Internal Medicine

## 2023-05-23 DIAGNOSIS — E1169 Type 2 diabetes mellitus with other specified complication: Secondary | ICD-10-CM

## 2023-05-25 ENCOUNTER — Other Ambulatory Visit: Payer: Self-pay | Admitting: Internal Medicine

## 2023-05-25 DIAGNOSIS — F5101 Primary insomnia: Secondary | ICD-10-CM

## 2023-06-01 ENCOUNTER — Ambulatory Visit: Payer: Medicare HMO | Admitting: Podiatry

## 2023-06-08 ENCOUNTER — Other Ambulatory Visit: Payer: Self-pay | Admitting: Cardiology

## 2023-06-09 ENCOUNTER — Other Ambulatory Visit: Payer: Self-pay | Admitting: Cardiology

## 2023-06-09 DIAGNOSIS — K219 Gastro-esophageal reflux disease without esophagitis: Secondary | ICD-10-CM

## 2023-06-14 ENCOUNTER — Other Ambulatory Visit: Payer: Self-pay | Admitting: Internal Medicine

## 2023-06-14 DIAGNOSIS — E1169 Type 2 diabetes mellitus with other specified complication: Secondary | ICD-10-CM

## 2023-06-19 ENCOUNTER — Other Ambulatory Visit: Payer: Self-pay | Admitting: Internal Medicine

## 2023-06-19 DIAGNOSIS — J309 Allergic rhinitis, unspecified: Secondary | ICD-10-CM

## 2023-06-21 ENCOUNTER — Other Ambulatory Visit: Payer: Self-pay | Admitting: Internal Medicine

## 2023-06-21 DIAGNOSIS — E1169 Type 2 diabetes mellitus with other specified complication: Secondary | ICD-10-CM

## 2023-06-22 ENCOUNTER — Ambulatory Visit (INDEPENDENT_AMBULATORY_CARE_PROVIDER_SITE_OTHER): Payer: Medicare HMO | Admitting: Podiatry

## 2023-06-22 ENCOUNTER — Encounter: Payer: Self-pay | Admitting: Podiatry

## 2023-06-22 DIAGNOSIS — L600 Ingrowing nail: Secondary | ICD-10-CM | POA: Diagnosis not present

## 2023-06-22 NOTE — Patient Instructions (Signed)

## 2023-06-23 NOTE — Progress Notes (Signed)
 Subjective:   Patient ID: Jeremy Jenkins, male   DOB: 73 y.o.   MRN: [id number]   HPI Patient states the fourth nail of his right foot has become thickened and it is bothering him more and he would like it removed and the other ones are doing well   ROS      Objective:  Physical Exam  Neuro vascular status intact with a thick deformed right fourth nail with diabetic under good control with good digital perfusion noted     Assessment:  Chronic damaged with thickness and pain fourth nailbed right that does not respond well to trimming      Plan:  H&P done recommended removal permanent he wants this done I allowed him to read then signed consent form and I anesthetized the right fourth toe 60 mg Xylocaine Marcaine mixture sterile prep done and using sterile instrumentation removed the nail exposed matrix applied phenol for applications 30 seconds followed by alcohol lavage sterile dressing gave instructions on soaks and to wear dressing 24 hours take it off earlier if throbbing were to occur and encouraged him to call with any questions which may arise

## 2023-07-10 ENCOUNTER — Other Ambulatory Visit: Payer: Self-pay | Admitting: Internal Medicine

## 2023-07-10 DIAGNOSIS — F5101 Primary insomnia: Secondary | ICD-10-CM

## 2023-07-15 ENCOUNTER — Other Ambulatory Visit: Payer: Self-pay | Admitting: Internal Medicine

## 2023-07-15 DIAGNOSIS — E1169 Type 2 diabetes mellitus with other specified complication: Secondary | ICD-10-CM

## 2023-08-02 ENCOUNTER — Other Ambulatory Visit: Payer: Self-pay | Admitting: Internal Medicine

## 2023-08-02 ENCOUNTER — Other Ambulatory Visit: Payer: Self-pay | Admitting: Cardiology

## 2023-08-02 DIAGNOSIS — J309 Allergic rhinitis, unspecified: Secondary | ICD-10-CM

## 2023-08-02 DIAGNOSIS — K219 Gastro-esophageal reflux disease without esophagitis: Secondary | ICD-10-CM

## 2023-08-18 ENCOUNTER — Other Ambulatory Visit: Payer: Self-pay | Admitting: Cardiology

## 2023-08-18 DIAGNOSIS — K219 Gastro-esophageal reflux disease without esophagitis: Secondary | ICD-10-CM

## 2023-08-28 ENCOUNTER — Other Ambulatory Visit: Payer: Self-pay | Admitting: Internal Medicine

## 2023-08-28 DIAGNOSIS — E1169 Type 2 diabetes mellitus with other specified complication: Secondary | ICD-10-CM

## 2023-09-05 ENCOUNTER — Other Ambulatory Visit: Payer: Self-pay | Admitting: Internal Medicine

## 2023-09-05 DIAGNOSIS — M51369 Other intervertebral disc degeneration, lumbar region without mention of lumbar back pain or lower extremity pain: Secondary | ICD-10-CM

## 2023-09-26 ENCOUNTER — Other Ambulatory Visit: Payer: Self-pay | Admitting: Internal Medicine

## 2023-09-26 DIAGNOSIS — E1169 Type 2 diabetes mellitus with other specified complication: Secondary | ICD-10-CM

## 2023-10-03 DIAGNOSIS — H1012 Acute atopic conjunctivitis, left eye: Secondary | ICD-10-CM | POA: Diagnosis not present

## 2023-10-24 ENCOUNTER — Other Ambulatory Visit: Payer: Self-pay | Admitting: Internal Medicine

## 2023-10-24 DIAGNOSIS — E1169 Type 2 diabetes mellitus with other specified complication: Secondary | ICD-10-CM

## 2023-11-03 ENCOUNTER — Ambulatory Visit: Admitting: Podiatry

## 2023-11-17 ENCOUNTER — Other Ambulatory Visit: Payer: Self-pay | Admitting: Cardiology

## 2023-11-21 ENCOUNTER — Other Ambulatory Visit: Payer: Self-pay | Admitting: Cardiology

## 2023-11-21 ENCOUNTER — Other Ambulatory Visit: Payer: Self-pay | Admitting: Internal Medicine

## 2023-11-21 DIAGNOSIS — E1169 Type 2 diabetes mellitus with other specified complication: Secondary | ICD-10-CM

## 2023-12-24 ENCOUNTER — Other Ambulatory Visit: Payer: Self-pay | Admitting: Internal Medicine

## 2023-12-25 ENCOUNTER — Other Ambulatory Visit: Payer: Self-pay | Admitting: Internal Medicine

## 2023-12-25 DIAGNOSIS — E1169 Type 2 diabetes mellitus with other specified complication: Secondary | ICD-10-CM

## 2024-01-13 NOTE — Progress Notes (Signed)
 Jeremy Jenkins                                          MRN: [id number]   01/13/2024   The [facility] Quality Team Specialist reviewed this patient medical record for the purposes of chart review for care gap closure. The following were reviewed: chart review for care gap closure-kidney health evaluation for diabetes:eGFR  and uACR.    [facility] Quality Team

## 2024-01-19 DIAGNOSIS — H524 Presbyopia: Secondary | ICD-10-CM | POA: Diagnosis not present

## 2024-01-20 ENCOUNTER — Telehealth: Payer: Self-pay | Admitting: Cardiology

## 2024-01-20 NOTE — Telephone Encounter (Signed)
 Called pt to schedule overdue recall. Pt stated he and his wife have sold their house and bought an RV. They are now traveling and will not be in the [place] area for at least 8 months. The pt stated he will be in [place]  for a few months and wants to know if Dr Alvan could recommend a Cardiologist in the [place] area and possibly send a referral. [phone] is the best number to reach the pt.

## 2024-01-20 NOTE — Telephone Encounter (Signed)
 Left a detailed message for patient (Jeremy Jenkins).

## 2024-01-20 NOTE — Telephone Encounter (Signed)
I will forward to Dr Branch 

## 2024-01-26 ENCOUNTER — Other Ambulatory Visit: Payer: Self-pay | Admitting: Internal Medicine

## 2024-01-26 DIAGNOSIS — F5101 Primary insomnia: Secondary | ICD-10-CM

## 2024-01-29 ENCOUNTER — Other Ambulatory Visit: Payer: Self-pay | Admitting: Internal Medicine

## 2024-01-29 DIAGNOSIS — E1169 Type 2 diabetes mellitus with other specified complication: Secondary | ICD-10-CM

## 2024-01-31 ENCOUNTER — Other Ambulatory Visit: Payer: Self-pay | Admitting: Internal Medicine

## 2024-03-04 ENCOUNTER — Other Ambulatory Visit: Payer: Self-pay | Admitting: Internal Medicine

## 2024-03-04 DIAGNOSIS — E1169 Type 2 diabetes mellitus with other specified complication: Secondary | ICD-10-CM

## 2024-03-26 ENCOUNTER — Other Ambulatory Visit: Payer: Self-pay | Admitting: Internal Medicine

## 2024-03-26 DIAGNOSIS — F5101 Primary insomnia: Secondary | ICD-10-CM

## 2024-03-26 DIAGNOSIS — M51369 Other intervertebral disc degeneration, lumbar region without mention of lumbar back pain or lower extremity pain: Secondary | ICD-10-CM

## 2024-03-26 MED ORDER — TRAZODONE HCL 50 MG PO TABS: 50.0000 mg | ORAL_TABLET | Freq: Every day | ORAL | 0 refills | Status: DC

## 2024-03-26 NOTE — Telephone Encounter (Signed)
 Copied from CRM #[id number]. Topic: Clinical - Medication Refill >> Mar 26, 2024  9:25 AM [name] wrote: Medication: 90 day supply  metoprolol  succinate (TOPROL -XL) 50 MG 24 hr tablet  traZODone  (DESYREL ) 50 MG tablet   traMADol  (ULTRAM ) 50 MG tablet    Has the patient contacted their pharmacy? Yes (Agent: If no, request that the patient contact the pharmacy for the refill. If patient does not wish to contact the pharmacy document the reason why and proceed with request.) (Agent: If yes, when and what did the pharmacy advise?)  This is the patient's preferred pharmacy:  [facility]  [address] [place], [place] [address] [phone]   Is this the correct pharmacy for this prescription? Yes If no, delete pharmacy and type the correct one.   Has the prescription been filled recently? Yes  Is the patient out of the medication? No, in 2 days he will run out  Has the patient been seen for an appointment in the last year OR does the patient have an upcoming appointment? Yes  Can we respond through MyChart? Yes  Agent: Please be advised that Rx refills may take up to 3 business days. We ask that you follow-up with your pharmacy.

## 2024-04-05 ENCOUNTER — Encounter: Payer: Self-pay | Admitting: Gastroenterology

## 2024-04-10 ENCOUNTER — Other Ambulatory Visit: Payer: Self-pay | Admitting: Internal Medicine

## 2024-04-10 ENCOUNTER — Telehealth: Payer: Self-pay | Admitting: Internal Medicine

## 2024-04-10 ENCOUNTER — Telehealth: Payer: Self-pay

## 2024-04-10 DIAGNOSIS — I25110 Atherosclerotic heart disease of native coronary artery with unstable angina pectoris: Secondary | ICD-10-CM

## 2024-04-10 DIAGNOSIS — E1169 Type 2 diabetes mellitus with other specified complication: Secondary | ICD-10-CM

## 2024-04-10 DIAGNOSIS — F5101 Primary insomnia: Secondary | ICD-10-CM

## 2024-04-10 DIAGNOSIS — M51369 Other intervertebral disc degeneration, lumbar region without mention of lumbar back pain or lower extremity pain: Secondary | ICD-10-CM

## 2024-04-10 MED ORDER — TRAZODONE HCL 50 MG PO TABS: 50.0000 mg | ORAL_TABLET | Freq: Every day | ORAL | 0 refills | Status: AC

## 2024-04-10 MED ORDER — METOPROLOL SUCCINATE ER 50 MG PO TB24: 50.0000 mg | ORAL_TABLET | Freq: Every day | ORAL | 0 refills | Status: DC

## 2024-04-10 MED ORDER — TRAZODONE HCL 50 MG PO TABS: 50.0000 mg | ORAL_TABLET | Freq: Every day | ORAL | 0 refills | Status: DC

## 2024-04-10 MED ORDER — METFORMIN HCL ER 500 MG PO TB24: 500.0000 mg | ORAL_TABLET | Freq: Every day | ORAL | 0 refills | Status: AC

## 2024-04-10 MED ORDER — METOPROLOL SUCCINATE ER 50 MG PO TB24: 50.0000 mg | ORAL_TABLET | Freq: Every day | ORAL | 0 refills | Status: AC

## 2024-04-10 NOTE — Telephone Encounter (Signed)
 Copied from CRM [id number]. Topic: Clinical - Medication Refill >> Apr 10, 2024 12:54 PM Sophia H wrote: Medication:  traMADol  (ULTRAM ) 50 MG tablet metFORMIN  (GLUCOPHAGE -XR) 500 MG 24 hr tablet  Has the patient contacted their pharmacy? Yes   This is the patient's preferred pharmacy:   [facility] [address], [place] - [address] Phone: [phone] Fax: [phone]    Is this the correct pharmacy for this prescription? Yes If no, delete pharmacy and type the correct one.   Has the prescription been filled recently? Yes  Is the patient out of the medication? Yes  Has the patient been seen for an appointment in the last year OR does the patient have an upcoming appointment? Yes, seen Jan 29,2025.  Can we respond through MyChart? Yes  Agent: Please be advised that Rx refills may take up to 3 business days. We ask that you follow-up with your pharmacy.

## 2024-04-10 NOTE — Telephone Encounter (Signed)
 Copied from CRM #[id number]. Topic: Clinical - Medication Refill >> Mar 26, 2024  9:25 AM [name] wrote: Medication: 90 day supply  metoprolol  succinate (TOPROL -XL) 50 MG 24 hr tablet  traZODone  (DESYREL ) 50 MG tablet   traMADol  (ULTRAM ) 50 MG tablet    Has the patient contacted their pharmacy? Yes (Agent: If no, request that the patient contact the pharmacy for the refill. If patient does not wish to contact the pharmacy document the reason why and proceed with request.) (Agent: If yes, when and what did the pharmacy advise?)  This is the patient's preferred pharmacy:  [facility]  [address] [place], [place] [address] [phone]   Is this the correct pharmacy for this prescription? Yes If no, delete pharmacy and type the correct one.   Has the prescription been filled recently? Yes  Is the patient out of the medication? No, in 2 days he will run out  Has the patient been seen for an appointment in the last year OR does the patient have an upcoming appointment? Yes  Can we respond through MyChart? Yes  Agent: Please be advised that Rx refills may take up to 3 business days. We ask that you follow-up with your pharmacy.

## 2024-04-10 NOTE — Telephone Encounter (Signed)
 Copied from CRM [id number]. Topic: Clinical - Medication Refill >> Apr 10, 2024 12:54 PM Sophia H wrote: Medication:  traMADol  (ULTRAM ) 50 MG tablet metFORMIN  (GLUCOPHAGE -XR) 500 MG 24 hr tablet  Has the patient contacted their pharmacy? Yes   This is the patient's preferred pharmacy:   [facility] [address], [place] - [address] Phone: [phone] Fax: [phone]    Is this the correct pharmacy for this prescription? Yes If no, delete pharmacy and type the correct one.   Has the prescription been filled recently? Yes  Is the patient out of the medication? Yes  Has the patient been seen for an appointment in the last year OR does the patient have an upcoming appointment? Yes, seen Jan 29,2025.  Can we respond through MyChart? Yes  Agent: Please be advised that Rx refills may take up to 3 business days. We ask that you follow-up with your pharmacy. >> Apr 10, 2024  3:18 PM Montie POUR wrote: Avontae is not able to come in next week because he is out of the state. He should be in [place]  in March, 2026 and he can complete a video visit. He needs medications traMADol  (ULTRAM ) 50 MG tablet before his next appointment 06/12/24. He wants to see if he can get 90 supply. Please send message through MyChart if needed.

## 2024-04-10 NOTE — Telephone Encounter (Signed)
 Left detailed vm explaining to pt message from pcp.

## 2024-04-11 NOTE — Telephone Encounter (Signed)
"  Left detailed vm for pt.  "

## 2024-04-13 ENCOUNTER — Other Ambulatory Visit: Payer: Self-pay | Admitting: Internal Medicine

## 2024-04-13 DIAGNOSIS — E1169 Type 2 diabetes mellitus with other specified complication: Secondary | ICD-10-CM

## 2024-04-18 ENCOUNTER — Ambulatory Visit: Payer: Self-pay | Admitting: Internal Medicine

## 2024-06-12 ENCOUNTER — Ambulatory Visit: Payer: Self-pay | Admitting: Internal Medicine

## 2024-07-09 ENCOUNTER — Ambulatory Visit: Admitting: Podiatry

## 2024-07-10 ENCOUNTER — Encounter: Payer: Self-pay | Admitting: Internal Medicine

## 2024-07-13 ENCOUNTER — Ambulatory Visit

## 2024-07-31 ENCOUNTER — Ambulatory Visit: Admitting: Cardiology
